# Patient Record
Sex: Female | Born: 1980 | Race: White | Hispanic: No | Marital: Married | Smoking: Former smoker
Health system: Southern US, Community
[De-identification: ages and names within clinical notes are randomized; demographics above are authoritative.]

## PROBLEM LIST (undated history)

## (undated) DIAGNOSIS — N200 Calculus of kidney: Secondary | ICD-10-CM

## (undated) DIAGNOSIS — G8929 Other chronic pain: Secondary | ICD-10-CM

## (undated) DIAGNOSIS — E559 Vitamin D deficiency, unspecified: Secondary | ICD-10-CM

## (undated) DIAGNOSIS — I10 Essential (primary) hypertension: Secondary | ICD-10-CM

## (undated) HISTORY — PX: ABDOMINAL HYSTERECTOMY: SHX81

## (undated) HISTORY — PX: TUBAL LIGATION: SHX77

---

## 2005-03-08 ENCOUNTER — Emergency Department: Payer: Self-pay | Admitting: Emergency Medicine

## 2005-04-20 ENCOUNTER — Emergency Department: Payer: Self-pay | Admitting: Emergency Medicine

## 2005-10-05 ENCOUNTER — Emergency Department: Payer: Self-pay | Admitting: Emergency Medicine

## 2008-08-21 ENCOUNTER — Emergency Department: Payer: Self-pay | Admitting: Emergency Medicine

## 2008-10-21 ENCOUNTER — Emergency Department: Payer: Self-pay | Admitting: Emergency Medicine

## 2008-11-24 ENCOUNTER — Emergency Department: Payer: Self-pay | Admitting: Emergency Medicine

## 2009-03-13 ENCOUNTER — Emergency Department: Payer: Self-pay | Admitting: Unknown Physician Specialty

## 2009-04-22 ENCOUNTER — Emergency Department: Payer: Self-pay | Admitting: Internal Medicine

## 2009-04-27 ENCOUNTER — Emergency Department: Payer: Self-pay | Admitting: Emergency Medicine

## 2009-05-05 ENCOUNTER — Ambulatory Visit: Payer: Self-pay | Admitting: Internal Medicine

## 2009-07-23 ENCOUNTER — Emergency Department: Payer: Self-pay | Admitting: Unknown Physician Specialty

## 2009-08-28 ENCOUNTER — Emergency Department: Payer: Self-pay | Admitting: Emergency Medicine

## 2009-09-13 ENCOUNTER — Emergency Department: Payer: Self-pay | Admitting: Emergency Medicine

## 2009-11-03 ENCOUNTER — Emergency Department: Payer: Self-pay | Admitting: Emergency Medicine

## 2009-11-24 ENCOUNTER — Emergency Department: Payer: Self-pay | Admitting: Emergency Medicine

## 2009-11-29 ENCOUNTER — Emergency Department: Payer: Self-pay | Admitting: Emergency Medicine

## 2010-03-12 ENCOUNTER — Emergency Department: Payer: Self-pay | Admitting: Emergency Medicine

## 2010-03-25 ENCOUNTER — Emergency Department: Payer: Self-pay | Admitting: Emergency Medicine

## 2010-05-11 ENCOUNTER — Emergency Department: Payer: Self-pay | Admitting: Emergency Medicine

## 2010-06-11 ENCOUNTER — Emergency Department: Payer: Self-pay | Admitting: Emergency Medicine

## 2010-08-10 ENCOUNTER — Emergency Department: Payer: Self-pay | Admitting: Emergency Medicine

## 2011-04-01 ENCOUNTER — Emergency Department: Payer: Self-pay | Admitting: Emergency Medicine

## 2011-07-14 IMAGING — US TRANSABDOMINAL ULTRASOUND OF PELVIS
1 series · 17 of 21 positions shown · non-contrast
Comparison: none

REASON FOR EXAM: lower abdominal pain hx of hysterectomy
COMMENTS:

[Series 1: transabdominal ultrasound of pelvis · 17 of 21 slices shown]
[im 1/21]
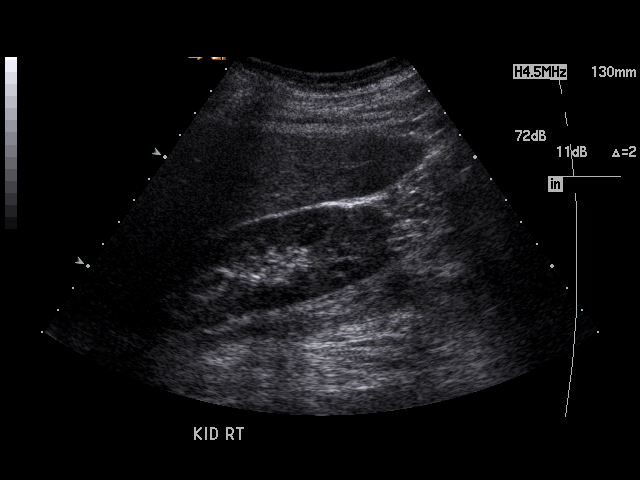
[im 2/21]
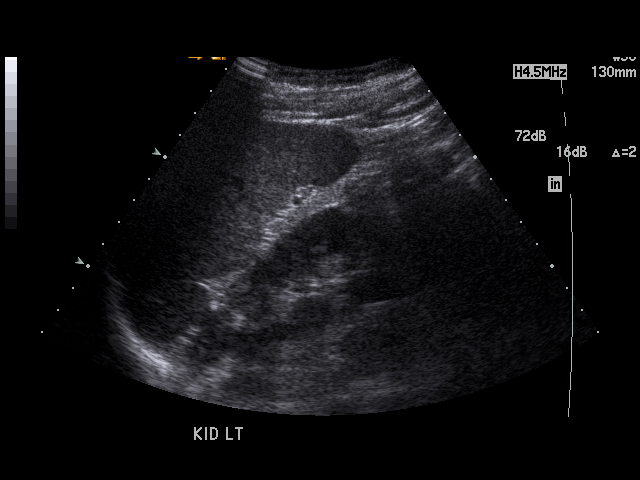
[im 4/21]
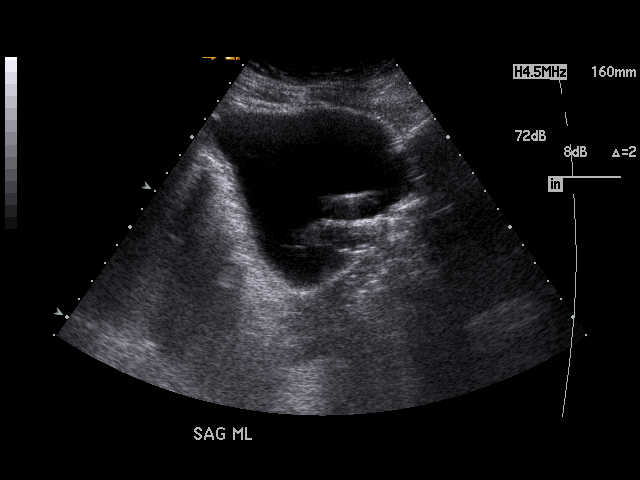
[im 5/21]
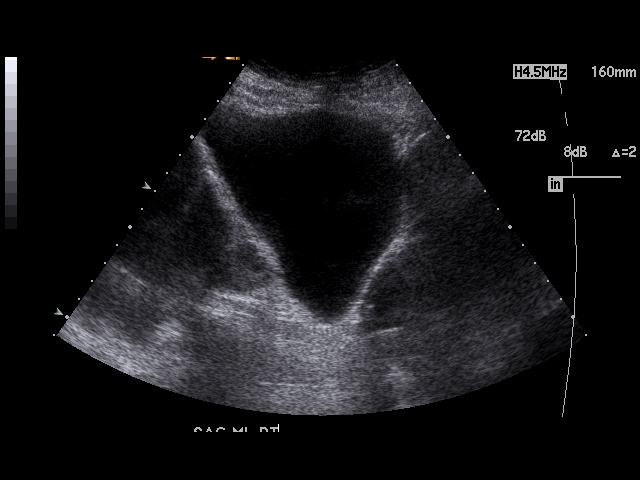
[im 6/21]
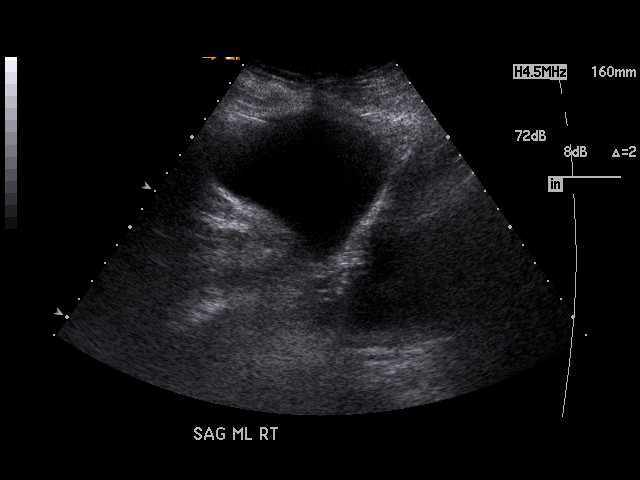
[im 7/21]
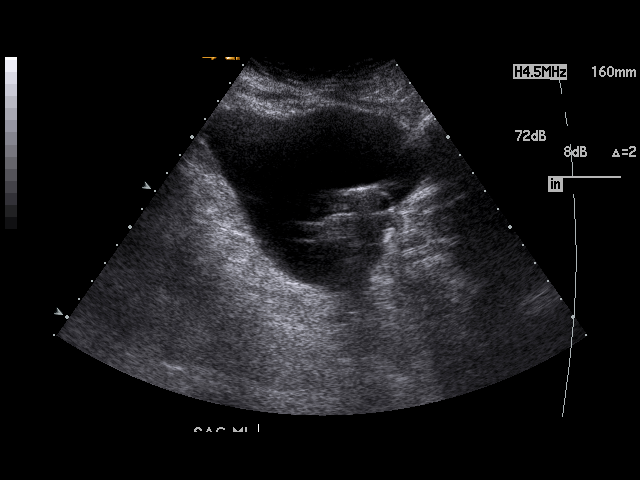
[im 9/21]
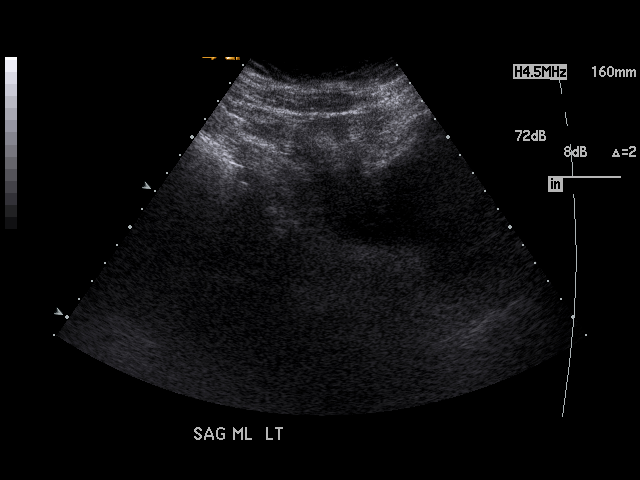
[im 10/21]
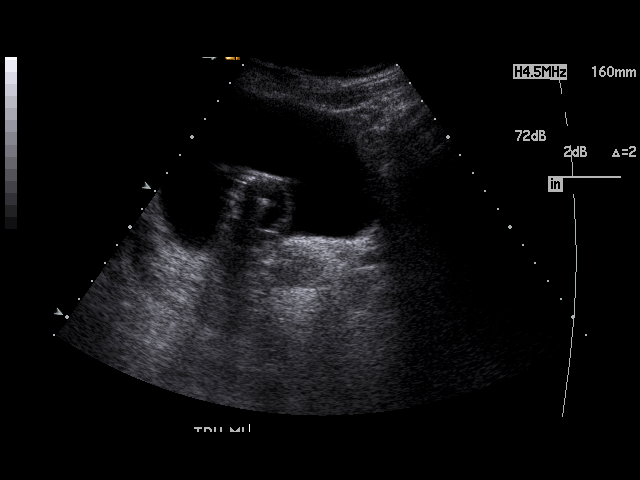
[im 11/21]
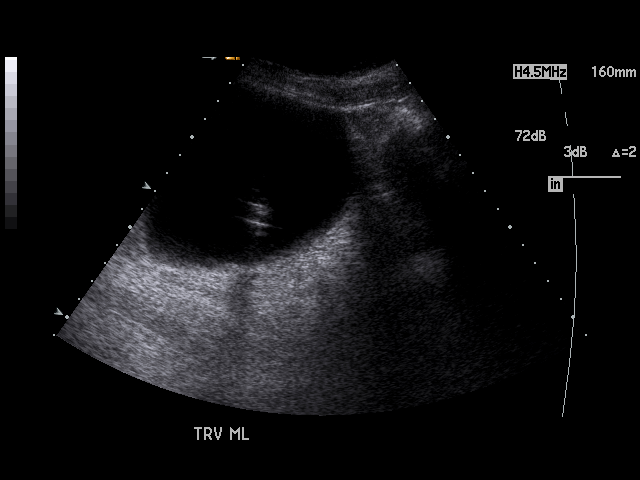
[im 12/21]
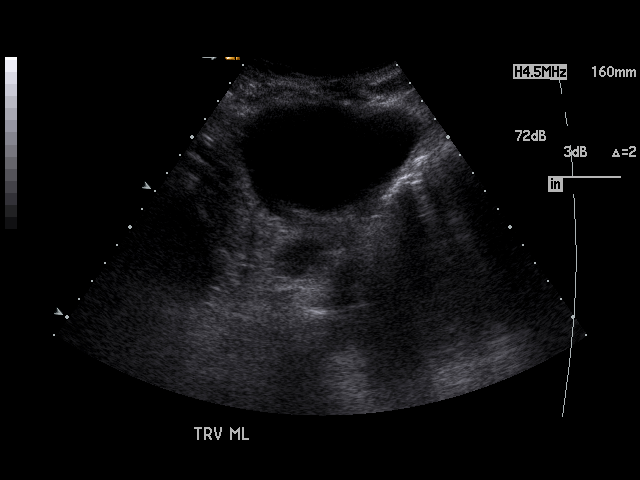
[im 13/21]
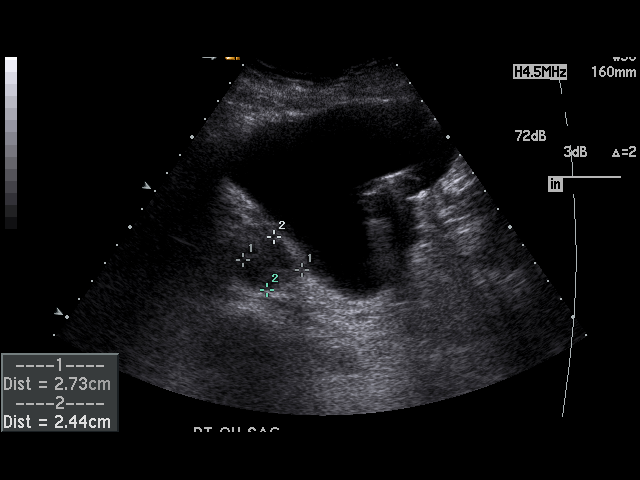
[im 15/21]
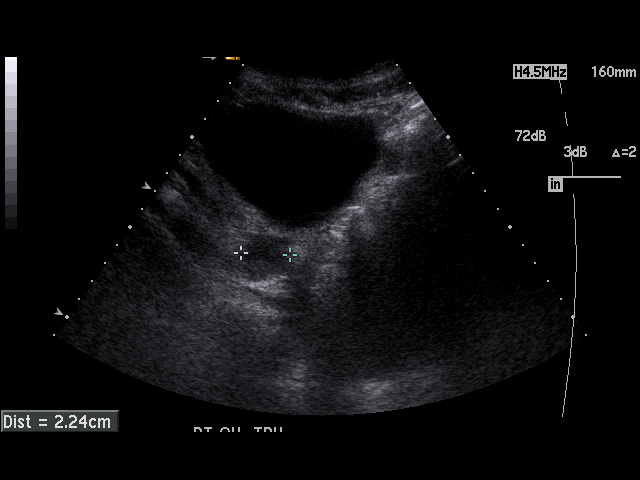
[im 16/21]
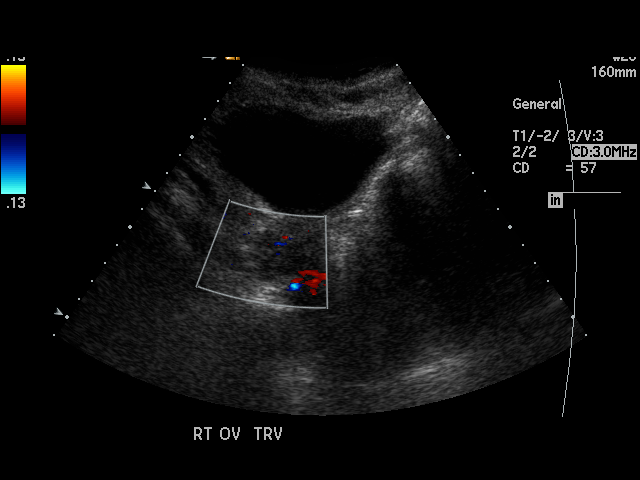
[im 17/21]
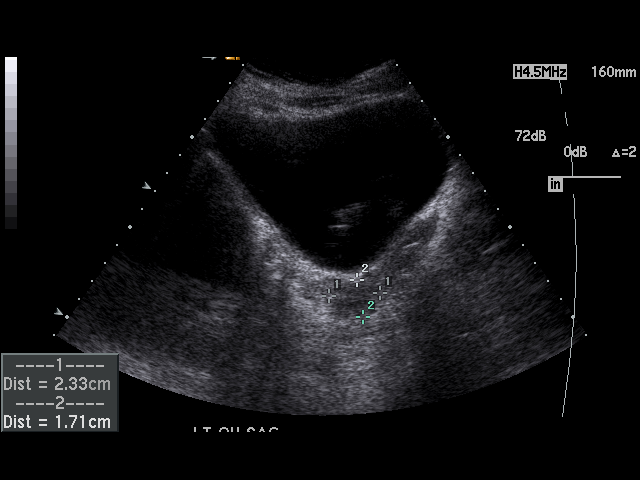
[im 18/21]
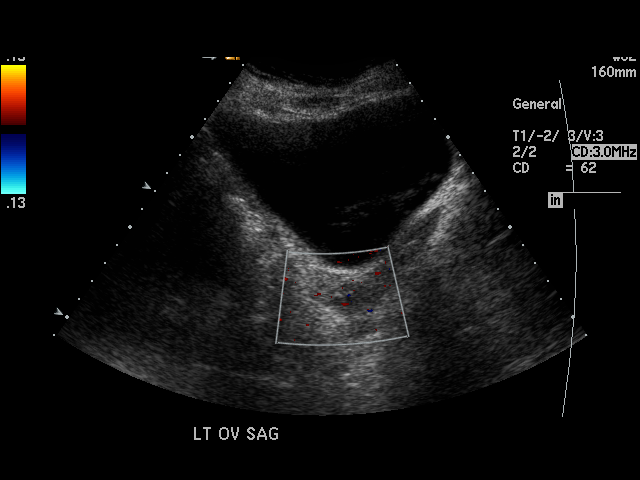
[im 20/21]
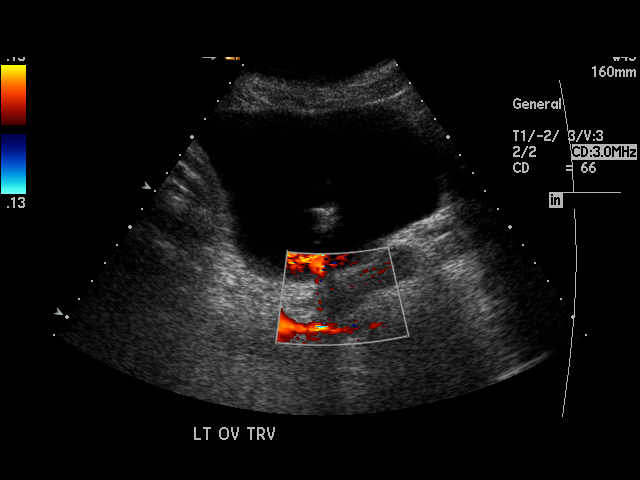
[im 21/21]
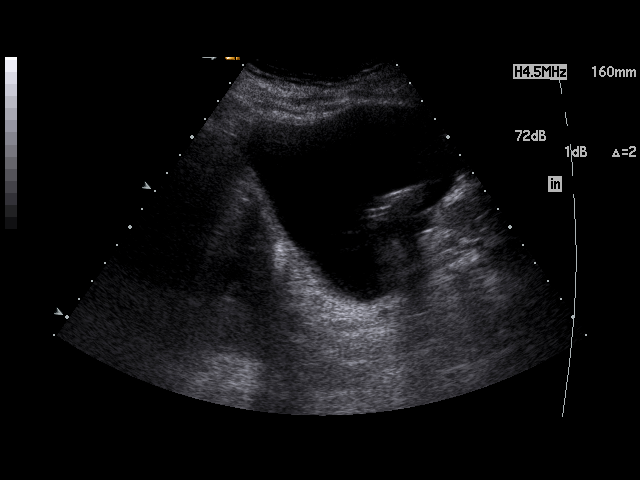

[17 of 21 positions shown; findings below may reference images not displayed]

PROCEDURE:     US  - US PELVIS EXAM  - August 10, 2010  [DATE]

RESULT:     Comparison: Pelvic ultrasound 06/11/2010

Technique and Findings:
Multiple grayscale and color Doppler images obtained of the pelvis.
Endovaginal ultrasound was not performed.

The uterus was not identified, consistent with prior hysterectomy. No fluid
identified in the cul-de-sac. A Foley catheter is present in the bladder.

The right ovary measures 2.7 x 2.4 x 2.2 cm. The left ovary measures 2.3 x
1.7 x 2.2 cm. Color Doppler flow is associated in the region of the ovaries.
Spectral Doppler imaging was not performed.
IMPRESSION: 1. No acute findings. Prior hysterectomy.
2. While the ovaries are normal in their transabdominal appearance, this
study does not exclude ovarian torsion. If there is continued clinical
concern, recommend endovaginal ultrasound with spectral Doppler imaging.

## 2011-08-31 ENCOUNTER — Emergency Department: Payer: Self-pay | Admitting: Emergency Medicine

## 2011-10-28 ENCOUNTER — Emergency Department: Payer: Self-pay | Admitting: Emergency Medicine

## 2011-11-16 ENCOUNTER — Emergency Department: Payer: Self-pay | Admitting: Internal Medicine

## 2012-03-11 ENCOUNTER — Emergency Department: Payer: Self-pay | Admitting: Emergency Medicine

## 2012-05-01 ENCOUNTER — Emergency Department: Payer: Self-pay | Admitting: *Deleted

## 2012-05-01 LAB — CBC WITH DIFFERENTIAL/PLATELET
Basophil %: 1.6 %
Eosinophil %: 0.9 %
HCT: 43.9 % (ref 35.0–47.0)
Lymphocyte %: 19.7 %
MCH: 29.5 pg (ref 26.0–34.0)
MCHC: 34.1 g/dL (ref 32.0–36.0)
MCV: 86 fL (ref 80–100)
Monocyte %: 5.5 %
Neutrophil #: 5.2 10*3/uL (ref 1.4–6.5)
Neutrophil %: 72.3 %
Platelet: 241 10*3/uL (ref 150–440)
RBC: 5.08 10*6/uL (ref 3.80–5.20)
RDW: 14.3 % (ref 11.5–14.5)

## 2012-05-01 LAB — BASIC METABOLIC PANEL
Anion Gap: 8 (ref 7–16)
Creatinine: 0.66 mg/dL (ref 0.60–1.30)
EGFR (Non-African Amer.): >60
Potassium: 3.6 mmol/L (ref 3.5–5.1)
Sodium: 142 mmol/L (ref 136–145)

## 2012-05-01 LAB — URINALYSIS, COMPLETE
Leukocyte Esterase: NEGATIVE
Nitrite: NEGATIVE
Ph: 8 (ref 4.5–8.0)
Protein: NEGATIVE
WBC UR: 1 /HPF (ref 0–5)

## 2012-07-11 ENCOUNTER — Emergency Department: Payer: Self-pay | Admitting: Emergency Medicine

## 2014-04-08 ENCOUNTER — Emergency Department: Payer: Self-pay | Admitting: Emergency Medicine

## 2014-10-03 ENCOUNTER — Emergency Department: Payer: Self-pay | Admitting: Student

## 2015-04-20 ENCOUNTER — Encounter: Payer: Self-pay | Admitting: Emergency Medicine

## 2015-04-20 ENCOUNTER — Emergency Department: Payer: Self-pay

## 2015-04-20 ENCOUNTER — Emergency Department
Admission: EM | Admit: 2015-04-20 | Discharge: 2015-04-20 | Disposition: A | Discharge status: Home / Self Care | Payer: Self-pay | Attending: Emergency Medicine | Admitting: Emergency Medicine

## 2015-04-20 DIAGNOSIS — Z3202 Encounter for pregnancy test, result negative: Secondary | ICD-10-CM | POA: Insufficient documentation

## 2015-04-20 DIAGNOSIS — Z720 Tobacco use: Secondary | ICD-10-CM | POA: Insufficient documentation

## 2015-04-20 DIAGNOSIS — R109 Unspecified abdominal pain: Secondary | ICD-10-CM

## 2015-04-20 DIAGNOSIS — R1010 Upper abdominal pain, unspecified: Secondary | ICD-10-CM | POA: Insufficient documentation

## 2015-04-20 DIAGNOSIS — I10 Essential (primary) hypertension: Secondary | ICD-10-CM | POA: Insufficient documentation

## 2015-04-20 HISTORY — DX: Other chronic pain: G89.29

## 2015-04-20 HISTORY — DX: Vitamin D deficiency, unspecified: E55.9

## 2015-04-20 HISTORY — DX: Essential (primary) hypertension: I10

## 2015-04-20 LAB — CBC WITH DIFFERENTIAL/PLATELET
Basophils Absolute: 0 10*3/uL (ref 0–0.1)
Basophils Relative: 0 %
EOS ABS: 0 10*3/uL (ref 0–0.7)
Eosinophils Relative: 1 %
HEMATOCRIT: 45.5 % (ref 35.0–47.0)
Hemoglobin: 15.5 g/dL (ref 12.0–16.0)
LYMPHS ABS: 1.3 10*3/uL (ref 1.0–3.6)
Lymphocytes Relative: 20 %
MCH: 27.7 pg (ref 26.0–34.0)
MCHC: 34 g/dL (ref 32.0–36.0)
MCV: 81.4 fL (ref 80.0–100.0)
Monocytes Absolute: 0.5 10*3/uL (ref 0.2–0.9)
Monocytes Relative: 8 %
NEUTROS PCT: 71 %
Neutro Abs: 4.4 10*3/uL (ref 1.4–6.5)
PLATELETS: 246 10*3/uL (ref 150–440)
RBC: 5.58 MIL/uL — ABNORMAL HIGH (ref 3.80–5.20)
RDW: 13.5 % (ref 11.5–14.5)
WBC: 6.2 10*3/uL (ref 3.6–11.0)

## 2015-04-20 LAB — URINALYSIS COMPLETE WITH MICROSCOPIC (ARMC ONLY)
Bilirubin Urine: NEGATIVE
GLUCOSE, UA: NEGATIVE mg/dL
KETONES UR: NEGATIVE mg/dL
LEUKOCYTES UA: NEGATIVE
NITRITE: NEGATIVE
Protein, ur: NEGATIVE mg/dL
SPECIFIC GRAVITY, URINE: 1.004 — AB (ref 1.005–1.030)
pH: 6 (ref 5.0–8.0)

## 2015-04-20 LAB — COMPREHENSIVE METABOLIC PANEL
ALBUMIN: 4.4 g/dL (ref 3.5–5.0)
ALK PHOS: 85 U/L (ref 38–126)
ALT: 18 U/L (ref 14–54)
AST: 36 U/L (ref 15–41)
Anion gap: 7 (ref 5–15)
BILIRUBIN TOTAL: 0.1 mg/dL — AB (ref 0.3–1.2)
BUN: 7 mg/dL (ref 6–20)
CO2: 25 mmol/L (ref 22–32)
Calcium: 8.9 mg/dL (ref 8.9–10.3)
Chloride: 104 mmol/L (ref 101–111)
Creatinine, Ser: 0.84 mg/dL (ref 0.44–1.00)
GFR calc Af Amer: >60 mL/min (ref >60)
GFR calc non Af Amer: >60 mL/min (ref >60)
GLUCOSE: 109 mg/dL — AB (ref 65–99)
Potassium: 3.7 mmol/L (ref 3.5–5.1)
Sodium: 136 mmol/L (ref 135–145)
Total Protein: 8.1 g/dL (ref 6.5–8.1)

## 2015-04-20 LAB — LIPASE, BLOOD: Lipase: 30 U/L (ref 22–51)

## 2015-04-20 LAB — PREGNANCY, URINE: Preg Test, Ur: NEGATIVE

## 2015-04-20 MED ORDER — OMEPRAZOLE 40 MG PO CPDR: 40.0000 mg | DELAYED_RELEASE_CAPSULE | Freq: Every day | ORAL | Status: DC

## 2015-04-20 MED ORDER — PANTOPRAZOLE SODIUM 40 MG PO TBEC
DELAYED_RELEASE_TABLET | ORAL | Status: AC
  Administered 2015-04-20: 40 mg via ORAL
  Filled 2015-04-20: qty 1

## 2015-04-20 MED ORDER — GI COCKTAIL ~~LOC~~
30.0000 mL | Freq: Once | ORAL | Status: AC
  Administered 2015-04-20: 30 mL via ORAL

## 2015-04-20 MED ORDER — SODIUM CHLORIDE 0.9 % IV SOLN
Freq: Once | INTRAVENOUS | Status: AC
  Administered 2015-04-20: 17:00:00 via INTRAVENOUS

## 2015-04-20 MED ORDER — HYDROCODONE-ACETAMINOPHEN 5-325 MG PO TABS: 1.0000 | ORAL_TABLET | ORAL | Status: DC | PRN

## 2015-04-20 MED ORDER — HYDROMORPHONE HCL 1 MG/ML IJ SOLN
INTRAMUSCULAR | Status: AC
  Filled 2015-04-20: qty 1

## 2015-04-20 MED ORDER — HYDROMORPHONE HCL 1 MG/ML IJ SOLN
INTRAMUSCULAR | Status: AC
  Administered 2015-04-20: 0.5 mg via INTRAVENOUS
  Filled 2015-04-20: qty 1

## 2015-04-20 MED ORDER — PANTOPRAZOLE SODIUM 40 MG PO TBEC
40.0000 mg | DELAYED_RELEASE_TABLET | Freq: Every day | ORAL | Status: DC
  Administered 2015-04-20: 40 mg via ORAL

## 2015-04-20 MED ORDER — ONDANSETRON HCL 4 MG/2ML IJ SOLN
INTRAMUSCULAR | Status: AC
  Filled 2015-04-20: qty 2

## 2015-04-20 MED ORDER — IOHEXOL 300 MG/ML  SOLN
100.0000 mL | Freq: Once | INTRAMUSCULAR | Status: AC | PRN
  Administered 2015-04-20: 100 mL via INTRAVENOUS

## 2015-04-20 MED ORDER — IOHEXOL 240 MG/ML SOLN
25.0000 mL | Freq: Once | INTRAMUSCULAR | Status: AC | PRN
  Administered 2015-04-20: 25 mL via ORAL

## 2015-04-20 MED ORDER — ONDANSETRON HCL 4 MG/2ML IJ SOLN
4.0000 mg | Freq: Once | INTRAMUSCULAR | Status: AC
  Administered 2015-04-20: 4 mg via INTRAVENOUS

## 2015-04-20 MED ORDER — HYDROMORPHONE HCL 1 MG/ML IJ SOLN
0.5000 mg | INTRAMUSCULAR | Status: AC | PRN
  Administered 2015-04-20 (×2): 0.5 mg via INTRAVENOUS

## 2015-04-20 MED ORDER — GI COCKTAIL ~~LOC~~
ORAL | Status: AC
  Administered 2015-04-20: 30 mL via ORAL
  Filled 2015-04-20: qty 30

## 2015-04-20 NOTE — Discharge Instructions (Signed)
Your ultrasound and CT of abdomen and pelvis were okay. We have not identified the cause of your upper abdominal discomfort today. Take omeprazole as prescribed. Follow-up with your regular doctor. He'll likely need to see a gastroenterologist as well. Return to the emergency department if your pain worsens or if he had other urgent concerns.  Abdominal Pain, Women Abdominal (stomach, pelvic, or belly) pain can be caused by many things. It is important to tell your doctor:  The location of the pain.  Does it come and go or is it present all the time?  Are there things that start the pain (eating certain foods, exercise)?  Are there other symptoms associated with the pain (fever, nausea, vomiting, diarrhea)? All of this is helpful to know when trying to find the cause of the pain. CAUSES   Stomach: virus or bacteria infection, or ulcer.  Intestine: appendicitis (inflamed appendix), regional ileitis (Crohn's disease), ulcerative colitis (inflamed colon), irritable bowel syndrome, diverticulitis (inflamed diverticulum of the colon), or cancer of the stomach or intestine.  Gallbladder disease or stones in the gallbladder.  Kidney disease, kidney stones, or infection.  Pancreas infection or cancer.  Fibromyalgia (pain disorder).  Diseases of the female organs:  Uterus: fibroid (non-cancerous) tumors or infection.  Fallopian tubes: infection or tubal pregnancy.  Ovary: cysts or tumors.  Pelvic adhesions (scar tissue).  Endometriosis (uterus lining tissue growing in the pelvis and on the pelvic organs).  Pelvic congestion syndrome (female organs filling up with blood just before the menstrual period).  Pain with the menstrual period.  Pain with ovulation (producing an egg).  Pain with an IUD (intrauterine device, birth control) in the uterus.  Cancer of the female organs.  Functional pain (pain not caused by a disease, may improve without treatment).  Psychological  pain.  Depression. DIAGNOSIS  Your doctor will decide the seriousness of your pain by doing an examination.  Blood tests.  X-rays.  Ultrasound.  CT scan (computed tomography, special type of X-ray).  MRI (magnetic resonance imaging).  Cultures, for infection.  Barium enema (dye inserted in the large intestine, to better view it with X-rays).  Colonoscopy (looking in intestine with a lighted tube).  Laparoscopy (minor surgery, looking in abdomen with a lighted tube).  Major abdominal exploratory surgery (looking in abdomen with a large incision). TREATMENT  The treatment will depend on the cause of the pain.   Many cases can be observed and treated at home.  Over-the-counter medicines recommended by your caregiver.  Prescription medicine.  Antibiotics, for infection.  Birth control pills, for painful periods or for ovulation pain.  Hormone treatment, for endometriosis.  Nerve blocking injections.  Physical therapy.  Antidepressants.  Counseling with a psychologist or psychiatrist.  Minor or major surgery. HOME CARE INSTRUCTIONS   Do not take laxatives, unless directed by your caregiver.  Take over-the-counter pain medicine only if ordered by your caregiver. Do not take aspirin because it can cause an upset stomach or bleeding.  Try a clear liquid diet (broth or water) as ordered by your caregiver. Slowly move to a bland diet, as tolerated, if the pain is related to the stomach or intestine.  Have a thermometer and take your temperature several times a day, and record it.  Bed rest and sleep, if it helps the pain.  Avoid sexual intercourse, if it causes pain.  Avoid stressful situations.  Keep your follow-up appointments and tests, as your caregiver orders.  If the pain does not go away with medicine  or surgery, you may try:  Acupuncture.  Relaxation exercises (yoga, meditation).  Group therapy.  Counseling. SEEK MEDICAL CARE IF:   You  notice certain foods cause stomach pain.  Your home care treatment is not helping your pain.  You need stronger pain medicine.  You want your IUD removed.  You feel faint or lightheaded.  You develop nausea and vomiting.  You develop a rash.  You are having side effects or an allergy to your medicine. SEEK IMMEDIATE MEDICAL CARE IF:   Your pain does not go away or gets worse.  You have a fever.  Your pain is felt only in portions of the abdomen. The right side could possibly be appendicitis. The left lower portion of the abdomen could be colitis or diverticulitis.  You are passing blood in your stools (bright red or black tarry stools, with or without vomiting).  You have blood in your urine.  You develop chills, with or without a fever.  You pass out. MAKE SURE YOU:   Understand these instructions.  Will watch your condition.  Will get help right away if you are not doing well or get worse. Document Released: 09/12/2007 Document Revised: 04/01/2014 Document Reviewed: 10/02/2009 [facility] Patient Information 2015 [employer] [place], [place]. This information is not intended to replace advice given to you by your health care provider. Make sure you discuss any questions you have with your health care provider.

## 2015-04-20 NOTE — ED Notes (Signed)
Patient left room for [place]

## 2015-04-20 NOTE — ED Notes (Addendum)
Pt reports for past couple of days abd pain with multiple episodes of diarrhea and one episode of vomiting yesterday. Denies fever. Reports "cold chills"

## 2015-04-20 NOTE — ED Provider Notes (Signed)
[facility] Emergency Department Provider Note  ____________________________________________  Time seen: 1625  I have reviewed the triage vital signs and the nursing notes.   HISTORY  Chief Complaint Abdominal Pain      HPI Shelly Munoz is a 34 y.o. female with upper abdominal pain for the past 3-4 days. The pain starting on Thursday was mild but has worsened and now is moderate to severe. She has a history of having had pancreatitis once before. This was a number of years ago and she says she is not sure if this is the same feeling or not. She does have some nausea. She feels the pain is radiating to her back primarily on the right side. She has had C-sections but no other surgical history. She still has her gallbladder.   Past Medical History  Diagnosis Date  . Vitamin D deficiency   . Hypertension   . Chronic pain     There are no active problems to display for this patient.   Past Surgical History  Procedure Laterality Date  . Cesarean section    . Tubal ligation    . Abdominal hysterectomy      Current Outpatient Rx  Name  Route  Sig  Dispense  Refill  . cholecalciferol (VITAMIN D) 1000 UNITS tablet   Oral   Take 1,000 Units by mouth.         Marland Kitchen HYDROcodone-acetaminophen (NORCO) 5-325 MG per tablet   Oral   Take 1 tablet by mouth every 4 (four) hours as needed for moderate pain.   10 tablet   0   . omeprazole (PRILOSEC) 40 MG capsule   Oral   Take 1 capsule (40 mg total) by mouth daily.   30 capsule   1     Allergies Toradol; Ketorolac; and Pyridium   History reviewed. No pertinent family history.  Social History History  Substance Use Topics  . Smoking status: Current Some Day Smoker -- 0.50 packs/day for 5 years    Types: Cigarettes  . Smokeless tobacco: Not on file  . Alcohol Use: No    Review of Systems  Constitutional: Negative for fever. ENT: Negative for sore throat. Cardiovascular: Negative for chest  pain. Respiratory: Negative for shortness of breath. Gastrointestinal: Notable for abdominal pain area to see history of present illness Genitourinary: Negative for dysuria. Musculoskeletal: Negative for back pain. Skin: Negative for rash. Neurological: Negative for headaches   10-point ROS otherwise negative.  ____________________________________________   PHYSICAL EXAM:  VITAL SIGNS: ED Triage Vitals  Enc Vitals Group     BP 04/20/15 1433 158/92 mmHg     Pulse Rate 04/20/15 1433 131     Resp 04/20/15 1433 20     Temp 04/20/15 1433 98.1 F (36.7 C)     Temp Source 04/20/15 1433 Oral     SpO2 04/20/15 1433 100 %     Weight 04/20/15 1433 170 lb (77.111 kg)     Height 04/20/15 1433 5' (1.524 m)     Head Cir --      Peak Flow --      Pain Score 04/20/15 1436 9     Pain Loc --      Pain Edu? --      Excl. in GC? --     Constitutional:  Alert and oriented. Well-nourished well-developed. She appears slightly uncomfortable.  ENT   Head: Normocephalic and atraumatic. Cardiovascular: Normal rate at 90, regular rhythm. Respiratory: Normal respiratory effort without  tachypnea. Breath sounds are clear and equal bilaterally. No wheezes/rales/rhonchi. Gastrointestinal: Soft. No distention. She has mild diffuse tenderness throughout but focal tenderness in the epigastric area. Discomfort in the right upper and left upper quadrants are equal.  Back: Mild muscle tension in her back. She does have CVA tenderness on the right. Musculoskeletal: Nontender with normal range of motion in all extremities.  No noted edema. Neurologic:  Normal speech and language. No gross focal neurologic deficits are appreciated.  Skin:  Skin is warm, dry. No rash noted. Psychiatric: Mood and affect are normal. Speech and behavior are normal.  ____________________________________________    LABS (pertinent positives/negatives)  White blood cell count is normal at 6.2 hemoglobin 15.5. Metabolic panel is  within normal limits. Liver enzymes are all within normal limits. Lipase is normal at 30. Urinalysis does not show any sign of infection.  ____________________________________________  RADIOLOGY  Ultrasound: No gallbladder stones no ductal dilatation. CT scan abdomen and pelvis: No acute changes.  ____________________________________________   ____________________________________________   INITIAL IMPRESSION / ASSESSMENT AND PLAN / ED COURSE  We will obtain an ultrasound of the right upper quadrant. Given the normal liver enzymes and the more central aspect of her pain my suspicion for biliary robins is low. If the ultrasound is normal we will pursue a CT scan through her belly.  I will treat her pain with Dilaudid and give her 1 L of normal saline IV.  ----------------------------------------- 9:43 PM on 04/20/2015 -----------------------------------------  Patient rechecked. She has had a little bit of discomfort recently but overall is feeling better. She has a negative ultrasound and a negative CT scan. She has normal blood tests and a normal lipase level.  I discussed with the patient [facility]. She may need to see a gastroenterologist as well. We will start her on omeprazole. I will prescribe a small amount of Vicodin for her discomfort. ____________________________________________   FINAL CLINICAL IMPRESSION(S) / ED DIAGNOSES  Final diagnoses:  Abdominal pain   unknown etiology.     Darien Ramusavid W Addie Alonge, MD 04/20/15 2147

## 2015-04-20 NOTE — ED Notes (Signed)
Pt finished contrast, CT called.

## 2015-04-20 NOTE — ED Notes (Signed)
Patient returned from US.

## 2015-08-08 ENCOUNTER — Encounter: Payer: Self-pay | Admitting: Urology

## 2015-08-08 ENCOUNTER — Ambulatory Visit: Payer: Self-pay | Admitting: Urology

## 2017-04-02 ENCOUNTER — Emergency Department
Admission: EM | Admit: 2017-04-02 | Discharge: 2017-04-02 | Disposition: A | Discharge status: Home / Self Care | Payer: Managed Care, Other (non HMO) | Attending: Emergency Medicine | Admitting: Emergency Medicine

## 2017-04-02 ENCOUNTER — Encounter: Payer: Self-pay | Admitting: Emergency Medicine

## 2017-04-02 ENCOUNTER — Emergency Department: Payer: Managed Care, Other (non HMO)

## 2017-04-02 DIAGNOSIS — R109 Unspecified abdominal pain: Secondary | ICD-10-CM | POA: Diagnosis not present

## 2017-04-02 DIAGNOSIS — F17210 Nicotine dependence, cigarettes, uncomplicated: Secondary | ICD-10-CM | POA: Insufficient documentation

## 2017-04-02 DIAGNOSIS — I10 Essential (primary) hypertension: Secondary | ICD-10-CM | POA: Insufficient documentation

## 2017-04-02 DIAGNOSIS — Z79899 Other long term (current) drug therapy: Secondary | ICD-10-CM | POA: Diagnosis not present

## 2017-04-02 HISTORY — DX: Calculus of kidney: N20.0

## 2017-04-02 LAB — BASIC METABOLIC PANEL
Anion gap: 9 (ref 5–15)
BUN: 7 mg/dL (ref 6–20)
CALCIUM: 9.3 mg/dL (ref 8.9–10.3)
CO2: 23 mmol/L (ref 22–32)
Chloride: 101 mmol/L (ref 101–111)
Creatinine, Ser: 0.86 mg/dL (ref 0.44–1.00)
GFR calc Af Amer: >60 mL/min (ref >60)
GFR calc non Af Amer: >60 mL/min (ref >60)
Glucose, Bld: 141 mg/dL — ABNORMAL HIGH (ref 65–99)
POTASSIUM: 3.6 mmol/L (ref 3.5–5.1)
SODIUM: 133 mmol/L — AB (ref 135–145)

## 2017-04-02 LAB — CBC
HCT: 48.2 % — ABNORMAL HIGH (ref 35.0–47.0)
Hemoglobin: 16.3 g/dL — ABNORMAL HIGH (ref 12.0–16.0)
MCH: 28.1 pg (ref 26.0–34.0)
MCHC: 33.9 g/dL (ref 32.0–36.0)
MCV: 83 fL (ref 80.0–100.0)
Platelets: 266 10*3/uL (ref 150–440)
RBC: 5.8 MIL/uL — AB (ref 3.80–5.20)
RDW: 13.4 % (ref 11.5–14.5)
WBC: 9.8 10*3/uL (ref 3.6–11.0)

## 2017-04-02 LAB — URINALYSIS, COMPLETE (UACMP) WITH MICROSCOPIC
BILIRUBIN URINE: NEGATIVE
Bacteria, UA: NONE SEEN
Glucose, UA: NEGATIVE mg/dL
Ketones, ur: NEGATIVE mg/dL
Leukocytes, UA: NEGATIVE
Nitrite: NEGATIVE
PH: 6 (ref 5.0–8.0)
Protein, ur: NEGATIVE mg/dL
RBC / HPF: NONE SEEN RBC/hpf (ref 0–5)
SPECIFIC GRAVITY, URINE: 1.001 — AB (ref 1.005–1.030)

## 2017-04-02 MED ORDER — MORPHINE SULFATE (PF) 4 MG/ML IV SOLN
4.0000 mg | Freq: Once | INTRAVENOUS | Status: AC
  Administered 2017-04-02: 4 mg via INTRAVENOUS
  Filled 2017-04-02: qty 1

## 2017-04-02 MED ORDER — SODIUM CHLORIDE 0.9 % IV BOLUS (SEPSIS)
1000.0000 mL | Freq: Once | INTRAVENOUS | Status: AC
  Administered 2017-04-02: 1000 mL via INTRAVENOUS

## 2017-04-02 MED ORDER — ONDANSETRON HCL 4 MG/2ML IJ SOLN
4.0000 mg | Freq: Once | INTRAMUSCULAR | Status: AC
  Administered 2017-04-02: 4 mg via INTRAVENOUS
  Filled 2017-04-02: qty 2

## 2017-04-02 NOTE — ED Notes (Signed)
Reviewed d/c instructions, follow-up care with patient. Pt verbalized understanding. Pt being driven home by spouse

## 2017-04-02 NOTE — ED Notes (Signed)
ED Provider at bedside. 

## 2017-04-02 NOTE — ED Triage Notes (Signed)
Pt presents to ED via POV, with reports of left flank pain and lower abdominal pain. Pt states sxs resemble prior kidney stones. Pt states the pain started on Wednesday. C/o decreased urination and decreased appetite. Denies fevers or vomiting. C/o nausea.

## 2017-04-02 NOTE — ED Provider Notes (Signed)
[facility]  ____________________________________________   First MD Initiated Contact with Patient 04/02/17 1845     (approximate)  I have reviewed the triage vital signs and the nursing notes.   HISTORY  Chief Complaint Flank Pain   HPI Ramonita LabJennifer L Slevin is a 36 y.o. female with history of multiple kidney stones was presenting with left flank pain over the past 4 days. She says the pain is now 7 out of 10 and sharp. He says it is radiating into her pelvis. She says that it is associated with nausea and diarrhea which is typical for her with her stones. She denies ever needing to have a stent or lithotripsy. She says she has been taking tramadol for pain relief but it is not been helping.   Past Medical History:  Diagnosis Date  . Chronic pain   . Hypertension   . Nephrolithiasis   . Vitamin D deficiency     There are no active problems to display for this patient.   Past Surgical History:  Procedure Laterality Date  . ABDOMINAL HYSTERECTOMY    . CESAREAN SECTION    . TUBAL LIGATION      Prior to Admission medications   Medication Sig Start Date End Date Taking? Authorizing Provider  cholecalciferol (VITAMIN D) 1000 UNITS tablet Take 1,000 Units by mouth.    [provider]  HYDROcodone-acetaminophen (NORCO) 5-325 MG per tablet Take 1 tablet by mouth every 4 (four) hours as needed for moderate pain. 04/20/15   Darien RamusKaminski, David W, MD  omeprazole (PRILOSEC) 40 MG capsule Take 1 capsule (40 mg total) by mouth daily. 04/20/15 04/19/16  Darien RamusKaminski, David W, MD    Allergies Flomax [tamsulosin hcl]; Toradol [ketorolac tromethamine]; Ketorolac; and Pyridium  [phenazopyridine hcl]  History reviewed. No pertinent family history.  Social History Social History  Substance Use Topics  . Smoking status: Current Every Day Smoker    Packs/day: 0.50    Years: 5.00    Types: Cigarettes  . Smokeless tobacco: Never  Used  . Alcohol use No    Review of Systems  Constitutional: No fever/chills Eyes: No visual changes. ENT: No sore throat. Cardiovascular: Denies chest pain. Respiratory: Denies shortness of breath. Gastrointestinal:  no vomiting.  No constipation. Genitourinary: Negative for dysuria. Musculoskeletal: as above Skin: Negative for rash. Neurological: Negative for headaches, focal weakness or numbness.   ____________________________________________   PHYSICAL EXAM:  VITAL SIGNS: ED Triage Vitals  Enc Vitals Group     BP 04/02/17 1650 (!) 156/97     Pulse Rate 04/02/17 1650 (!) 138     Resp 04/02/17 1650 16     Temp 04/02/17 1650 98.1 F (36.7 C)     Temp Source 04/02/17 1650 Oral     SpO2 04/02/17 1650 100 %     Weight 04/02/17 1650 160 lb (72.6 kg)     Height 04/02/17 1650 5' (1.524 m)     Head Circumference --      Peak Flow --      Pain Score 04/02/17 1653 7     Pain Loc --      Pain Edu? --      Excl. in GC? --     Constitutional: Alert and oriented. Appears uncomfortable. Eyes: Conjunctivae are normal. PERRL. EOMI. Head: Atraumatic. Nose: No congestion/rhinnorhea. Mouth/Throat: Mucous membranes are moist.   Neck: No stridor.   Cardiovascular: Normal rate, regular rhythm. Grossly normal heart sounds.   Respiratory: Normal  respiratory effort.  No retractions. Lungs CTAB. Gastrointestinal: Soft Left-sided abdominal pain that is moderate without any rebound or guarding. No distention. Left-sided CVA tenderness to palpation. Musculoskeletal: No lower extremity tenderness nor edema.  No joint effusions. Neurologic:  Normal speech and language. No gross focal neurologic deficits are appreciated.  Skin:  Skin is warm, dry and intact. No rash noted. Psychiatric: Mood and affect are normal. Speech and behavior are normal.  ____________________________________________   LABS (all labs ordered are listed, but only abnormal results are displayed)  Labs Reviewed    URINALYSIS, COMPLETE (UACMP) WITH MICROSCOPIC - Abnormal; Notable for the following:       Result Value   Color, Urine STRAW (*)    APPearance HAZY (*)    Specific Gravity, Urine 1.001 (*)    Hgb urine dipstick SMALL (*)    Squamous Epithelial / LPF 0-5 (*)    All other components within normal limits  CBC - Abnormal; Notable for the following:    RBC 5.80 (*)    Hemoglobin 16.3 (*)    HCT 48.2 (*)    All other components within normal limits  BASIC METABOLIC PANEL - Abnormal; Notable for the following:    Sodium 133 (*)    Glucose, Bld 141 (*)    All other components within normal limits   ____________________________________________  EKG   ____________________________________________  RADIOLOGY  CT RENAL STONE STUDY (Final result)  Result time 04/02/17 19:43:09  Final result by Adrian Prows, MD (04/02/17 19:43:09)           Narrative:   CLINICAL DATA: Left flank pain for 3 days  EXAM: CT ABDOMEN AND PELVIS WITHOUT CONTRAST  TECHNIQUE: Multidetector CT imaging of the abdomen and pelvis was performed following the standard protocol without IV contrast.  COMPARISON: 04/20/2015  FINDINGS: Lower chest: Mild subpleural nodularity on the right is unchanged. No acute consolidation or pleural effusion. The heart is nonenlarged.  Hepatobiliary: No focal liver abnormality is seen. No gallstones, gallbladder wall thickening, or biliary dilatation.  Pancreas: Unremarkable. No pancreatic ductal dilatation or surrounding inflammatory changes.  Spleen: Normal in size without focal abnormality.  Adrenals/Urinary Tract: Adrenal glands are unremarkable. Kidneys are normal, without renal calculi, focal lesion, or hydronephrosis. Bladder is unremarkable.  Stomach/Bowel: Stomach is within normal limits. Appendix appears normal. No evidence of bowel wall thickening, distention, or inflammatory changes.  Vascular/Lymphatic: No significant vascular findings are  present. No enlarged abdominal or pelvic lymph nodes.  Reproductive: Status post hysterectomy. No adnexal masses.  Other: Small at the umbilicus. No free air or free fluid.  Musculoskeletal: No acute or significant osseous findings.  IMPRESSION: Negative for nephrolithiasis, hydronephrosis, or urethral stone. There are no acute abnormalities visualized.   Electronically Signed By: Jasmine Pang M.D. On: 04/02/2017 19:43            ____________________________________________   PROCEDURES  Procedure(s) performed:   Procedures  Critical Care performed:   ____________________________________________   INITIAL IMPRESSION / ASSESSMENT AND PLAN / ED COURSE  Pertinent labs & imaging results that were available during my care of the patient were reviewed by me and considered in my medical decision making (see chart for details).  Previous scans reviewed and do not see any renal ultrasounds or stones on her previous CAT scan. We will do a renal scan at this time. We'll also treat patient with morphine and Zofran. Says that she has an allergy to Toradol.     ----------------------------------------- 9:18 PM on 04/02/2017 -----------------------------------------  Patient without findings of stone on the CT scan. Also examine the flank and there is no rash. Patient denies any shortness of breath or chest pain. No increased pain with deep breathing. Unclear cause of the flank pain. Patient will be discharged and will follow-up with her primary care doctor. Says that it felt like a stone and asked if it's possibility of the stone pass but says she is still having mild pain. However, no distress at this time. Heart rate in the 80s. Unlikely to be pulmonary embolus. ____________________________________________   FINAL CLINICAL IMPRESSION(S) / ED DIAGNOSES  Final diagnoses:  Left flank pain      NEW MEDICATIONS STARTED DURING THIS VISIT:  New Prescriptions   No  medications on file     Note:  This document was prepared using Dragon voice recognition software and may include unintentional dictation errors.    Myrna Blazer, MD 04/02/17 2119

## 2021-01-11 ENCOUNTER — Emergency Department
Admission: EM | Admit: 2021-01-11 | Discharge: 2021-01-11 | Disposition: A | Discharge status: Home / Self Care | Payer: Self-pay | Attending: Emergency Medicine | Admitting: Emergency Medicine

## 2021-01-11 ENCOUNTER — Other Ambulatory Visit: Payer: Self-pay

## 2021-01-11 ENCOUNTER — Emergency Department: Payer: Self-pay

## 2021-01-11 DIAGNOSIS — R519 Headache, unspecified: Secondary | ICD-10-CM | POA: Insufficient documentation

## 2021-01-11 DIAGNOSIS — F17210 Nicotine dependence, cigarettes, uncomplicated: Secondary | ICD-10-CM | POA: Insufficient documentation

## 2021-01-11 DIAGNOSIS — M25551 Pain in right hip: Secondary | ICD-10-CM | POA: Insufficient documentation

## 2021-01-11 DIAGNOSIS — F1490 Cocaine use, unspecified, uncomplicated: Secondary | ICD-10-CM | POA: Insufficient documentation

## 2021-01-11 DIAGNOSIS — M791 Myalgia, unspecified site: Secondary | ICD-10-CM | POA: Insufficient documentation

## 2021-01-11 DIAGNOSIS — M542 Cervicalgia: Secondary | ICD-10-CM | POA: Insufficient documentation

## 2021-01-11 DIAGNOSIS — S0011XA Contusion of right eyelid and periocular area, initial encounter: Secondary | ICD-10-CM | POA: Insufficient documentation

## 2021-01-11 DIAGNOSIS — I10 Essential (primary) hypertension: Secondary | ICD-10-CM | POA: Insufficient documentation

## 2021-01-11 DIAGNOSIS — F1910 Other psychoactive substance abuse, uncomplicated: Secondary | ICD-10-CM

## 2021-01-11 DIAGNOSIS — F1099 Alcohol use, unspecified with unspecified alcohol-induced disorder: Secondary | ICD-10-CM | POA: Insufficient documentation

## 2021-01-11 MED ORDER — ACETAMINOPHEN 500 MG PO TABS
1000.0000 mg | ORAL_TABLET | Freq: Once | ORAL | Status: AC
  Administered 2021-01-11: 1000 mg via ORAL
  Filled 2021-01-11: qty 2

## 2021-01-11 MED ORDER — KETOROLAC TROMETHAMINE 30 MG/ML IJ SOLN
15.0000 mg | Freq: Once | INTRAMUSCULAR | Status: AC
  Administered 2021-01-11: 15 mg via INTRAVENOUS
  Filled 2021-01-11: qty 1

## 2021-01-11 MED ORDER — ONDANSETRON HCL 4 MG/2ML IJ SOLN
4.0000 mg | Freq: Once | INTRAMUSCULAR | Status: AC
  Administered 2021-01-11: 4 mg via INTRAVENOUS
  Filled 2021-01-11: qty 2

## 2021-01-11 MED ORDER — IOHEXOL 350 MG/ML SOLN
75.0000 mL | Freq: Once | INTRAVENOUS | Status: AC | PRN
  Administered 2021-01-11: 75 mL via INTRAVENOUS
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
## 2021-01-11 NOTE — ED Notes (Signed)
SANE nurse in with pt
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
## 2021-01-11 NOTE — Discharge Instructions (Addendum)
Please take Tylenol and ibuprofen/Advil for your pain.  It is safe to take them together, or to alternate them every few hours.  Take up to 1000mg of Tylenol at a time, up to 4 times per day.  Do not take more than 4000 mg of Tylenol in 24 hours.  For ibuprofen, take 400-600 mg, 4-5 times per day. ° ° °

## 2021-01-11 NOTE — ED Notes (Signed)
Pt's husband to pick up pt in 30 minutes

## 2021-01-11 NOTE — ED Notes (Addendum)
This RN sat and spoke with patient after she had expressed that she did not have anywhere to go and was ready to talk to someone about her current relationship and living situation. She had previously been offered to see the [facility] nurse earlier in the evening but "she wasn't ready to answer questions".  Report given to SANE nurse.

## 2021-01-11 NOTE — ED Provider Notes (Signed)
  Patient received in signout from Dr. Don Perking pending S[facility] nurse evaluation. SANE came by to evaluate the patient, but patient is now refusing their evaluation and will not effectively interact or wake up for this evaluation.  I go to reassess the patient and she indicates that she is ready to go home.  She reports calling her ex-husband already and having him call patient's father to come pick the patient up.  She denies any suicidality and does not want help for polysubstance abuse at this time.  We discussed following up with [facility] as an outpatient.  We discussed return precautions for the ED.  Patient medically stable for outpatient management.   Delton Prairie, MD 01/11/21 [phone]
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
## 2021-01-11 NOTE — ED Notes (Signed)
Pt refused wheelchair, this RN walked with pt to husband's car. Pt given resources for mental health and substance abuse counseling. Pt encouraged to seek help from these places.Pt states understanding.
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
## 2021-01-11 NOTE — ED Notes (Signed)
Pt's emergency contact called, [name]. [name] states that he and pt's father are not available to pick her up. Pt state she does not have a phone and can't remember anyone's numbers. Mr. Kendra to call back.

## 2021-05-12 ENCOUNTER — Other Ambulatory Visit: Payer: Self-pay

## 2021-05-12 ENCOUNTER — Emergency Department: Payer: BC Managed Care – PPO

## 2021-05-12 DIAGNOSIS — K760 Fatty (change of) liver, not elsewhere classified: Secondary | ICD-10-CM | POA: Diagnosis present

## 2021-05-12 DIAGNOSIS — I10 Essential (primary) hypertension: Secondary | ICD-10-CM | POA: Diagnosis present

## 2021-05-12 DIAGNOSIS — Z885 Allergy status to narcotic agent status: Secondary | ICD-10-CM

## 2021-05-12 DIAGNOSIS — A4151 Sepsis due to Escherichia coli [E. coli]: Secondary | ICD-10-CM | POA: Diagnosis not present

## 2021-05-12 DIAGNOSIS — F17210 Nicotine dependence, cigarettes, uncomplicated: Secondary | ICD-10-CM | POA: Diagnosis present

## 2021-05-12 DIAGNOSIS — Z888 Allergy status to other drugs, medicaments and biological substances status: Secondary | ICD-10-CM

## 2021-05-12 DIAGNOSIS — N390 Urinary tract infection, site not specified: Secondary | ICD-10-CM | POA: Diagnosis present

## 2021-05-12 DIAGNOSIS — R1011 Right upper quadrant pain: Secondary | ICD-10-CM | POA: Diagnosis not present

## 2021-05-12 DIAGNOSIS — Z20822 Contact with and (suspected) exposure to covid-19: Secondary | ICD-10-CM | POA: Diagnosis present

## 2021-05-12 DIAGNOSIS — Z79899 Other long term (current) drug therapy: Secondary | ICD-10-CM

## 2021-05-12 DIAGNOSIS — G8929 Other chronic pain: Secondary | ICD-10-CM | POA: Diagnosis present

## 2021-05-12 LAB — CBC WITH DIFFERENTIAL/PLATELET
Abs Immature Granulocytes: 0.28 10*3/uL — ABNORMAL HIGH (ref 0.00–0.07)
Basophils Absolute: 0.1 10*3/uL (ref 0.0–0.1)
Basophils Relative: 0 %
Eosinophils Absolute: 0 10*3/uL (ref 0.0–0.5)
Eosinophils Relative: 0 %
HCT: 34 % — ABNORMAL LOW (ref 36.0–46.0)
Hemoglobin: 11.1 g/dL — ABNORMAL LOW (ref 12.0–15.0)
Immature Granulocytes: 1 %
Lymphocytes Relative: 5 %
Lymphs Abs: 1.1 10*3/uL (ref 0.7–4.0)
MCH: 28.2 pg (ref 26.0–34.0)
MCHC: 32.6 g/dL (ref 30.0–36.0)
MCV: 86.5 fL (ref 80.0–100.0)
Monocytes Absolute: 0.9 10*3/uL (ref 0.1–1.0)
Monocytes Relative: 4 %
Neutro Abs: 20.7 10*3/uL — ABNORMAL HIGH (ref 1.7–7.7)
Neutrophils Relative %: 90 %
Platelets: 425 10*3/uL — ABNORMAL HIGH (ref 150–400)
RBC: 3.93 MIL/uL (ref 3.87–5.11)
RDW: 14.5 % (ref 11.5–15.5)
WBC: 23.1 10*3/uL — ABNORMAL HIGH (ref 4.0–10.5)
nRBC: 0 % (ref 0.0–0.2)

## 2021-05-12 LAB — COMPREHENSIVE METABOLIC PANEL
ALT: 29 U/L (ref 0–44)
AST: 22 U/L (ref 15–41)
Albumin: 2.7 g/dL — ABNORMAL LOW (ref 3.5–5.0)
Alkaline Phosphatase: 430 U/L — ABNORMAL HIGH (ref 38–126)
Anion gap: 9 (ref 5–15)
BUN: 16 mg/dL (ref 6–20)
CO2: 26 mmol/L (ref 22–32)
Calcium: 9 mg/dL (ref 8.9–10.3)
Chloride: 98 mmol/L (ref 98–111)
Creatinine, Ser: 1.2 mg/dL — ABNORMAL HIGH (ref 0.44–1.00)
GFR, Estimated: 59 mL/min — ABNORMAL LOW (ref >60)
Glucose, Bld: 169 mg/dL — ABNORMAL HIGH (ref 70–99)
Potassium: 4.3 mmol/L (ref 3.5–5.1)
Sodium: 133 mmol/L — ABNORMAL LOW (ref 135–145)
Total Bilirubin: 0.7 mg/dL (ref 0.3–1.2)
Total Protein: 6.8 g/dL (ref 6.5–8.1)

## 2021-05-12 NOTE — ED Triage Notes (Signed)
Pt present via POV c/o right flank pain for a few days. Reports urinary hesitancy. Denies dysuria. + nausea.

## 2021-05-13 ENCOUNTER — Inpatient Hospital Stay
Admission: EM | Admit: 2021-05-13 | Discharge: 2021-05-15 | DRG: 872 | Disposition: A | Discharge status: Home / Self Care | Payer: BC Managed Care – PPO | Attending: Internal Medicine | Admitting: Internal Medicine

## 2021-05-13 ENCOUNTER — Inpatient Hospital Stay: Payer: BC Managed Care – PPO

## 2021-05-13 ENCOUNTER — Other Ambulatory Visit: Payer: Self-pay

## 2021-05-13 ENCOUNTER — Encounter: Payer: Self-pay | Admitting: Internal Medicine

## 2021-05-13 ENCOUNTER — Emergency Department: Payer: BC Managed Care – PPO

## 2021-05-13 DIAGNOSIS — Z20822 Contact with and (suspected) exposure to covid-19: Secondary | ICD-10-CM | POA: Diagnosis present

## 2021-05-13 DIAGNOSIS — R1011 Right upper quadrant pain: Secondary | ICD-10-CM

## 2021-05-13 DIAGNOSIS — I10 Essential (primary) hypertension: Secondary | ICD-10-CM | POA: Diagnosis present

## 2021-05-13 DIAGNOSIS — A4151 Sepsis due to Escherichia coli [E. coli]: Secondary | ICD-10-CM | POA: Diagnosis present

## 2021-05-13 DIAGNOSIS — A419 Sepsis, unspecified organism: Secondary | ICD-10-CM | POA: Diagnosis not present

## 2021-05-13 DIAGNOSIS — R109 Unspecified abdominal pain: Secondary | ICD-10-CM | POA: Diagnosis not present

## 2021-05-13 DIAGNOSIS — G8929 Other chronic pain: Secondary | ICD-10-CM | POA: Diagnosis present

## 2021-05-13 DIAGNOSIS — F17210 Nicotine dependence, cigarettes, uncomplicated: Secondary | ICD-10-CM | POA: Diagnosis present

## 2021-05-13 DIAGNOSIS — Z79899 Other long term (current) drug therapy: Secondary | ICD-10-CM | POA: Diagnosis not present

## 2021-05-13 DIAGNOSIS — Z888 Allergy status to other drugs, medicaments and biological substances status: Secondary | ICD-10-CM | POA: Diagnosis not present

## 2021-05-13 DIAGNOSIS — K760 Fatty (change of) liver, not elsewhere classified: Secondary | ICD-10-CM | POA: Diagnosis present

## 2021-05-13 DIAGNOSIS — N390 Urinary tract infection, site not specified: Secondary | ICD-10-CM | POA: Diagnosis present

## 2021-05-13 DIAGNOSIS — Z885 Allergy status to narcotic agent status: Secondary | ICD-10-CM | POA: Diagnosis not present

## 2021-05-13 LAB — URINALYSIS, COMPLETE (UACMP) WITH MICROSCOPIC
Bacteria, UA: NONE SEEN
Bilirubin Urine: NEGATIVE
Glucose, UA: NEGATIVE mg/dL
Ketones, ur: NEGATIVE mg/dL
Nitrite: NEGATIVE
Protein, ur: 30 mg/dL — AB
Specific Gravity, Urine: 1.013 (ref 1.005–1.030)
pH: 5 (ref 5.0–8.0)

## 2021-05-13 LAB — CBC
HCT: 28.4 % — ABNORMAL LOW (ref 36.0–46.0)
Hemoglobin: 9.4 g/dL — ABNORMAL LOW (ref 12.0–15.0)
MCH: 29 pg (ref 26.0–34.0)
MCHC: 33.1 g/dL (ref 30.0–36.0)
MCV: 87.7 fL (ref 80.0–100.0)
Platelets: 344 10*3/uL (ref 150–400)
RBC: 3.24 MIL/uL — ABNORMAL LOW (ref 3.87–5.11)
RDW: 14.6 % (ref 11.5–15.5)
WBC: 28.6 10*3/uL — ABNORMAL HIGH (ref 4.0–10.5)
nRBC: 0 % (ref 0.0–0.2)

## 2021-05-13 LAB — RESP PANEL BY RT-PCR (FLU A&B, COVID) ARPGX2
Influenza A by PCR: NEGATIVE
Influenza B by PCR: NEGATIVE
SARS Coronavirus 2 by RT PCR: NEGATIVE

## 2021-05-13 LAB — COMPREHENSIVE METABOLIC PANEL
ALT: 24 U/L (ref 0–44)
AST: 16 U/L (ref 15–41)
Albumin: 2.3 g/dL — ABNORMAL LOW (ref 3.5–5.0)
Alkaline Phosphatase: 365 U/L — ABNORMAL HIGH (ref 38–126)
Anion gap: 6 (ref 5–15)
BUN: 18 mg/dL (ref 6–20)
CO2: 26 mmol/L (ref 22–32)
Calcium: 8.8 mg/dL — ABNORMAL LOW (ref 8.9–10.3)
Chloride: 102 mmol/L (ref 98–111)
Creatinine, Ser: 1.18 mg/dL — ABNORMAL HIGH (ref 0.44–1.00)
GFR, Estimated: >60 mL/min (ref >60)
Glucose, Bld: 112 mg/dL — ABNORMAL HIGH (ref 70–99)
Potassium: 4.5 mmol/L (ref 3.5–5.1)
Sodium: 134 mmol/L — ABNORMAL LOW (ref 135–145)
Total Bilirubin: 0.9 mg/dL (ref 0.3–1.2)
Total Protein: 5.9 g/dL — ABNORMAL LOW (ref 6.5–8.1)

## 2021-05-13 LAB — PROCALCITONIN
Procalcitonin: 1.25 ng/mL
Procalcitonin: 1.42 ng/mL

## 2021-05-13 LAB — TROPONIN I (HIGH SENSITIVITY): Troponin I (High Sensitivity): 16 ng/L (ref <18)

## 2021-05-13 LAB — LACTIC ACID, PLASMA: Lactic Acid, Venous: 1.4 mmol/L (ref 0.5–1.9)

## 2021-05-13 LAB — PROTIME-INR
INR: 1.2 (ref 0.8–1.2)
Prothrombin Time: 15.6 seconds — ABNORMAL HIGH (ref 11.4–15.2)

## 2021-05-13 LAB — LIPASE, BLOOD: Lipase: 19 U/L (ref 11–51)

## 2021-05-13 LAB — HIV ANTIBODY (ROUTINE TESTING W REFLEX): HIV Screen 4th Generation wRfx: NONREACTIVE

## 2021-05-13 LAB — CORTISOL-AM, BLOOD: Cortisol - AM: 11.8 ug/dL (ref 6.7–22.6)

## 2021-05-13 MED ORDER — MORPHINE SULFATE (PF) 2 MG/ML IV SOLN
2.0000 mg | INTRAVENOUS | Status: DC | PRN
  Administered 2021-05-13 – 2021-05-15 (×15): 2 mg via INTRAVENOUS
  Filled 2021-05-13 (×15): qty 1

## 2021-05-13 MED ORDER — ONDANSETRON HCL 4 MG/2ML IJ SOLN: 4.0000 mg | Freq: Four times a day (QID) | INTRAMUSCULAR | Status: DC | PRN

## 2021-05-13 MED ORDER — ACETAMINOPHEN 325 MG PO TABS
650.0000 mg | ORAL_TABLET | Freq: Four times a day (QID) | ORAL | Status: DC | PRN
  Administered 2021-05-13: 650 mg via ORAL
  Filled 2021-05-13: qty 2

## 2021-05-13 MED ORDER — VANCOMYCIN HCL IN DEXTROSE 1-5 GM/200ML-% IV SOLN
1000.0000 mg | Freq: Once | INTRAVENOUS | Status: AC
  Administered 2021-05-13: 05:00:00 1000 mg via INTRAVENOUS
  Filled 2021-05-13: qty 200

## 2021-05-13 MED ORDER — LACTATED RINGERS IV BOLUS (SEPSIS)
250.0000 mL | Freq: Once | INTRAVENOUS | Status: AC
  Administered 2021-05-13: 250 mL via INTRAVENOUS

## 2021-05-13 MED ORDER — ACETAMINOPHEN 650 MG RE SUPP
650.0000 mg | Freq: Four times a day (QID) | RECTAL | Status: DC | PRN
  Filled 2021-05-13: qty 1

## 2021-05-13 MED ORDER — ONDANSETRON HCL 4 MG/2ML IJ SOLN
4.0000 mg | Freq: Once | INTRAMUSCULAR | Status: AC
  Administered 2021-05-13: 01:00:00 4 mg via INTRAVENOUS
  Filled 2021-05-13: qty 2

## 2021-05-13 MED ORDER — FENTANYL CITRATE (PF) 100 MCG/2ML IJ SOLN
50.0000 ug | Freq: Once | INTRAMUSCULAR | Status: AC
  Administered 2021-05-13: 50 ug via INTRAVENOUS
  Filled 2021-05-13: qty 2

## 2021-05-13 MED ORDER — SODIUM CHLORIDE 0.9 % IV SOLN: INTRAVENOUS | Status: DC

## 2021-05-13 MED ORDER — CEFTRIAXONE SODIUM 2 G IJ SOLR
2.0000 g | INTRAMUSCULAR | Status: DC
Start: 2021-05-13 — End: 2021-05-15
  Administered 2021-05-13 – 2021-05-15 (×3): 2 g via INTRAVENOUS
  Filled 2021-05-13: qty 2
  Filled 2021-05-13 (×3): qty 20

## 2021-05-13 MED ORDER — ACETAMINOPHEN 500 MG PO TABS
1000.0000 mg | ORAL_TABLET | Freq: Once | ORAL | Status: AC
  Administered 2021-05-13: 02:00:00 1000 mg via ORAL
  Filled 2021-05-13: qty 2

## 2021-05-13 MED ORDER — KETOROLAC TROMETHAMINE 30 MG/ML IJ SOLN
15.0000 mg | Freq: Once | INTRAMUSCULAR | Status: AC
  Administered 2021-05-13: 02:00:00 15 mg via INTRAVENOUS

## 2021-05-13 MED ORDER — ENOXAPARIN SODIUM 40 MG/0.4ML IJ SOSY: 40.0000 mg | PREFILLED_SYRINGE | INTRAMUSCULAR | Status: DC

## 2021-05-13 MED ORDER — CALCIUM CARBONATE ANTACID 500 MG PO CHEW
1.0000 | CHEWABLE_TABLET | Freq: Once | ORAL | Status: AC
  Administered 2021-05-13: 200 mg via ORAL
  Filled 2021-05-13: qty 1

## 2021-05-13 MED ORDER — LACTATED RINGERS IV SOLN: INTRAVENOUS | Status: AC

## 2021-05-13 MED ORDER — CALCIUM CARBONATE ANTACID 500 MG PO CHEW: 1.0000 | CHEWABLE_TABLET | Freq: Four times a day (QID) | ORAL | Status: DC | PRN

## 2021-05-13 MED ORDER — SODIUM CHLORIDE 0.9 % IV SOLN
1.0000 g | Freq: Once | INTRAVENOUS | Status: AC
  Administered 2021-05-13: 04:00:00 1 g via INTRAVENOUS
  Filled 2021-05-13: qty 1

## 2021-05-13 MED ORDER — LACTATED RINGERS IV BOLUS
1000.0000 mL | Freq: Once | INTRAVENOUS | Status: AC
  Administered 2021-05-13: 01:00:00 1000 mL via INTRAVENOUS

## 2021-05-13 MED ORDER — ENOXAPARIN SODIUM 40 MG/0.4ML IJ SOSY
40.0000 mg | PREFILLED_SYRINGE | INTRAMUSCULAR | Status: DC
  Administered 2021-05-13 – 2021-05-14 (×2): 40 mg via SUBCUTANEOUS
  Filled 2021-05-13 (×4): qty 0.4

## 2021-05-13 MED ORDER — METRONIDAZOLE 500 MG/100ML IV SOLN
500.0000 mg | Freq: Three times a day (TID) | INTRAVENOUS | Status: DC
  Administered 2021-05-13 – 2021-05-14 (×4): 500 mg via INTRAVENOUS
  Filled 2021-05-13 (×6): qty 100

## 2021-05-13 MED ORDER — KETOROLAC TROMETHAMINE 30 MG/ML IJ SOLN
15.0000 mg | Freq: Once | INTRAMUSCULAR | Status: AC
  Administered 2021-05-13: 01:00:00 15 mg via INTRAVENOUS
  Filled 2021-05-13: qty 1

## 2021-05-13 MED ORDER — LACTATED RINGERS IV BOLUS (SEPSIS)
1000.0000 mL | Freq: Once | INTRAVENOUS | Status: AC
  Administered 2021-05-13: 1000 mL via INTRAVENOUS

## 2021-05-13 MED ORDER — HYDROCODONE-ACETAMINOPHEN 5-325 MG PO TABS
1.0000 | ORAL_TABLET | ORAL | Status: DC | PRN
  Administered 2021-05-13 – 2021-05-15 (×9): 2 via ORAL
  Filled 2021-05-13 (×9): qty 2

## 2021-05-13 MED ORDER — ONDANSETRON HCL 4 MG PO TABS: 4.0000 mg | ORAL_TABLET | Freq: Four times a day (QID) | ORAL | Status: DC | PRN

## 2021-05-13 NOTE — ED Provider Notes (Signed)
[facility] Emergency Department Provider Note  ____________________________________________  Time seen: Approximately 1:10 AM  I have reviewed the triage vital signs and the nursing notes.   HISTORY  Chief Complaint Flank Pain   HPI Shelly Munoz is a 40 y.o. female who presents for evaluation of right flank pain.  Patient reports 2 days sharp constant right flank and right-sided abdominal pain associated with nausea and chills.  Patient has been vomiting and has been unable to keep anything down.  She denies diarrhea or constipation, chest pain or shortness of breath, dysuria or hematuria, vaginal discharge.  She reports that she was sick with a upper respiratory infection 2-3 weeks ago and has a little bit of a lingering cough.  Denies any personal or family history of blood clots, recent travel immobilization, leg pain or swelling, hemoptysis or exogenous hormones.  Past Medical History:  Diagnosis Date   Chronic pain    Hypertension    Nephrolithiasis    Vitamin D deficiency     There are no problems to display for this patient.   Past Surgical History:  Procedure Laterality Date   ABDOMINAL HYSTERECTOMY     CESAREAN SECTION     TUBAL LIGATION      Prior to Admission medications   Medication Sig Start Date End Date Taking? Authorizing Provider  cholecalciferol (VITAMIN D) 1000 UNITS tablet Take 1,000 Units by mouth.    [provider]  HYDROcodone-acetaminophen (NORCO) 5-325 MG per tablet Take 1 tablet by mouth every 4 (four) hours as needed for moderate pain. 04/20/15   Darien Ramus, MD  omeprazole (PRILOSEC) 40 MG capsule Take 1 capsule (40 mg total) by mouth daily. 04/20/15 04/19/16  Darien Ramus, MD    Allergies Flomax [tamsulosin hcl], Toradol [ketorolac tromethamine], Ketorolac, and Pyridium  [phenazopyridine hcl]  No family history on file.  Social History Social History   Tobacco Use   Smoking status: Every  Day    Packs/day: 0.50    Years: 5.00    Pack years: 2.50    Types: Cigarettes   Smokeless tobacco: Never  Substance Use Topics   Alcohol use: No   Drug use: No    Review of Systems  Constitutional: Negative for fever. + chills Eyes: Negative for visual changes. ENT: Negative for sore throat. Neck: No neck pain  Cardiovascular: Negative for chest pain. Respiratory: Negative for shortness of breath. + cough Gastrointestinal: + R sided abdominal pain and nausea. no vomiting or diarrhea. Genitourinary: Negative for dysuria. Musculoskeletal: Negative for back pain. Skin: Negative for rash. Neurological: Negative for headaches, weakness or numbness. Psych: No SI or HI  ____________________________________________   PHYSICAL EXAM:  VITAL SIGNS: ED Triage Vitals [05/12/21 2128]  Enc Vitals Group     BP 110/67     Pulse Rate 99     Resp 14     Temp 99.5 F (37.5 C)     Temp Source Oral     SpO2 99 %     Weight      Height      Head Circumference      Peak Flow      Pain Score 7     Pain Loc      Pain Edu?      Excl. in GC?     Constitutional: Alert and oriented. Well appearing and in no apparent distress. HEENT:      Head: Normocephalic and atraumatic.  Eyes: Conjunctivae are normal. Sclera is non-icteric.       Mouth/Throat: Mucous membranes are moist.       Neck: Supple with no signs of meningismus. Cardiovascular: Regular rate and rhythm. No murmurs, gallops, or rubs. 2+ symmetrical distal pulses are present in all extremities. No JVD. Respiratory: Normal respiratory effort. Lungs are clear to auscultation bilaterally.  Gastrointestinal: Soft, diffusely tender on the right quadrants with no rebound or guarding  Genitourinary: R CVA tenderness. Musculoskeletal:  No edema, cyanosis, or erythema of extremities. Neurologic: Normal speech and language. Face is symmetric. Moving all extremities. No gross focal neurologic deficits are appreciated. Skin: Skin  is warm, dry and intact. No rash noted. Psychiatric: Mood and affect are normal. Speech and behavior are normal.  ____________________________________________   LABS (all labs ordered are listed, but only abnormal results are displayed)  Labs Reviewed  CBC WITH DIFFERENTIAL/PLATELET - Abnormal; Notable for the following components:      Result Value   WBC 23.1 (*)    Hemoglobin 11.1 (*)    HCT 34.0 (*)    Platelets 425 (*)    Neutro Abs 20.7 (*)    Abs Immature Granulocytes 0.28 (*)    All other components within normal limits  COMPREHENSIVE METABOLIC PANEL - Abnormal; Notable for the following components:   Sodium 133 (*)    Glucose, Bld 169 (*)    Creatinine, Ser 1.20 (*)    Albumin 2.7 (*)    Alkaline Phosphatase 430 (*)    GFR, Estimated 59 (*)    All other components within normal limits  URINALYSIS, COMPLETE (UACMP) WITH MICROSCOPIC - Abnormal; Notable for the following components:   Color, Urine YELLOW (*)    APPearance HAZY (*)    Hgb urine dipstick MODERATE (*)    Protein, ur 30 (*)    Leukocytes,Ua MODERATE (*)    All other components within normal limits  RESP PANEL BY RT-PCR (FLU A&B, COVID) ARPGX2  CULTURE, BLOOD (ROUTINE X 2)  CULTURE, BLOOD (ROUTINE X 2)  URINE CULTURE  LIPASE, BLOOD  PROCALCITONIN  LACTIC ACID, PLASMA  TROPONIN I (HIGH SENSITIVITY)   ____________________________________________  EKG  ED ECG REPORT I, Nita Sickle, the attending physician, personally viewed and interpreted this ECG.  Sinus tachycardia, rate of 109, normal intervals, normal axis, no ST elevations or depressions ____________________________________________  RADIOLOGY  I have personally reviewed the images performed during this visit and I agree with the Radiologist's read.   Interpretation by Radiologist:  DG Chest 2 View  Result Date: 05/13/2021 CLINICAL DATA:  Leukocytosis, fever, right flank pain for several days EXAM: CHEST - 2 VIEW COMPARISON:  None.  FINDINGS: No consolidation, features of edema, pneumothorax, or effusion. Pulmonary vascularity is normally distributed. The cardiomediastinal contours are unremarkable. No acute osseous or soft tissue abnormality. IMPRESSION: No acute cardiopulmonary abnormality. Electronically Signed   By: Kreg Shropshire M.D.   On: 05/13/2021 02:05   CT Renal Stone Study  Result Date: 05/12/2021 CLINICAL DATA:  40 year old female with flank pain. Concern for kidney stone. EXAM: CT ABDOMEN AND PELVIS WITHOUT CONTRAST TECHNIQUE: Multidetector CT imaging of the abdomen and pelvis was performed following the standard protocol without IV contrast. COMPARISON:  CT abdomen pelvis dated 04/02/2017. FINDINGS: Evaluation of this exam is limited in the absence of intravenous contrast. Lower chest: The visualized lung bases are clear. No intra-abdominal free air. Trace free fluid in the pelvis. Hepatobiliary: There is fatty infiltration of the liver. The liver is mildly enlarged.  Correlation with LFTs recommended to evaluate for possibility of steatohepatitis. No intrahepatic biliary dilatation. The gallbladder is unremarkable. Pancreas: Unremarkable. No pancreatic ductal dilatation or surrounding inflammatory changes. Spleen: Normal in size without focal abnormality. Adrenals/Urinary Tract: The adrenal glands unremarkable. There is no hydronephrosis or nephrolithiasis on either side. The visualized ureters and urinary bladder appear unremarkable. Stomach/Bowel: There is no bowel obstruction or active inflammation. The appendix is normal. Vascular/Lymphatic: The abdominal aorta and IVC are grossly unremarkable on this noncontrast CT. No portal venous gas. There is no adenopathy. Reproductive: Hysterectomy. No adnexal masses. Other: None Musculoskeletal: No acute or significant osseous findings. IMPRESSION: 1. No acute intra-abdominal or pelvic pathology. No hydronephrosis or nephrolithiasis. 2. Fatty liver with mild hepatomegaly.  Correlation with LFTs recommended to evaluate for possibility of steatohepatitis. 3. No bowel obstruction. Normal appendix. Electronically Signed   By: Elgie CollardArash  Radparvar M.D.   On: 05/12/2021 23:36     ____________________________________________   PROCEDURES  Procedure(s) performed:yes .1-3 Lead EKG Interpretation  Date/Time: 05/13/2021 1:53 AM Performed by: Nita SickleVeronese, Erick, MD Authorized by: Nita SickleVeronese, , MD     Interpretation: non-specific     ECG rate assessment: tachycardic     Rhythm: sinus tachycardia     Ectopy: none     Conduction: normal     Critical Care performed: yes  CRITICAL CARE Performed by: Nita Sicklearolina Demika Langenderfer  ?  Total critical care time: 30 min  Critical care time was exclusive of separately billable procedures and treating other patients.  Critical care was necessary to treat or prevent imminent or life-threatening deterioration.  Critical care was time spent personally by me on the following activities: development of treatment plan with patient and/or surrogate as well as nursing, discussions with consultants, evaluation of patient's response to treatment, examination of patient, obtaining history from patient or surrogate, ordering and performing treatments and interventions, ordering and review of laboratory studies, ordering and review of radiographic studies, pulse oximetry and re-evaluation of patient's condition.  ____________________________________________   INITIAL IMPRESSION / ASSESSMENT AND PLAN / ED COURSE  40 y.o. female who presents for evaluation of right flank pain and right-sided abdominal pain, chills, nausea and vomiting for 2 days.  During my evaluation patient found to be febrile with a temp of 101F orally, tachycardic with a pulse of 111, abdomen is diffusely tender on the right quadrants with right CVA tenderness but no rebound or guarding.  She has a mild cough but lungs are clear to auscultation.  Differential diagnosis  including pyelonephritis versus pneumonia versus COVID versus flu versus kidney stone versus diverticulitis versus appendicitis versus gallbladder pathology.  CT renal showing no signs of kidney stone or any other abnormality with patient's gallbladder or appendix.  UA showing some leuks but no bacteria or nitrite.  Patient denies vaginal discharge and declines a pelvic exam.  Lab work showing white count of 23.1 with a left shift.  Creatinine of 1.20, no significant electrolyte derangements, normal LFTs and lipase.  Procalcitonin, lactic acid, blood cultures are pending.  Will do COVID and flu swab.  We will get a chest x-ray.  Will give IV fluids, IV Toradol, IV Zofran, and Tylenol for symptom relief.  We will hold off on antibiotics until COVID swab is back.  Patient placed on telemetry for monitoring of cardiorespiratory status.    _________________________ 3:20 AM on 05/13/2021 ----------------------------------------- Lactic is normal.  Procalcitonin is elevated at 1.25.  Chest x-ray negative for pneumonia.  COVID and flu negative.  At this time patient meets  sepsis criteria.  Will start broad-spectrum antibiotics.  Cultures have been sent.  _____________________________________________ Please note:  Patient was evaluated in Emergency Department today for the symptoms described in the history of present illness. Patient was evaluated in the context of the global COVID-19 pandemic, which necessitated consideration that the patient might be at risk for infection with the SARS-CoV-2 virus that causes COVID-19. Institutional protocols and algorithms that pertain to the evaluation of patients at risk for COVID-19 are in a state of rapid change based on information released by regulatory bodies including the CDC and federal and state organizations. These policies and algorithms were followed during the patient's care in the ED.  Some ED evaluations and interventions may be delayed as a result of limited  staffing during the pandemic.   [place] Controlled Substance Database was reviewed by me. ____________________________________________   FINAL CLINICAL IMPRESSION(S) / ED DIAGNOSES   Final diagnoses:  Sepsis without acute organ dysfunction, due to unspecified organism [facility])      NEW MEDICATIONS STARTED DURING THIS VISIT:  ED Discharge Orders     None        Note:  This document was prepared using Dragon voice recognition software and may include unintentional dictation errors.    Don Perking, [place], MD 05/13/21 [phone]

## 2021-05-13 NOTE — ED Notes (Signed)
Pt reports right flank pain that is tender to palpation that has been going on for a few days.

## 2021-05-13 NOTE — ED Notes (Signed)
Pt taken to [facility] 

## 2021-05-13 NOTE — ED Notes (Signed)
Patient returned to ED from [facility]. Patient reportedly has received narcotic medication too recently for her procedure. Patient to receive her NM scan tomorrow. Patient aware.

## 2021-05-13 NOTE — ED Notes (Signed)
Patient transported to inpatient unit via wheelchair with transport.

## 2021-05-13 NOTE — Progress Notes (Signed)
PROGRESS NOTE    Shelly Munoz  IRJ:[id number] DOB: August 13, 1981 DOA: 05/13/2021 PCP: Laneta Simmers, NP (Inactive)   Brief Narrative:  Shelly Munoz is a 40 y.o. female with history of low back pain, and  nephrolithiasis who presents with RUQ pain radiating to back, associated with nausea, vomiting, fever and chills. Had upper respiratory tract symptoms a couple weeks ago, which has for the most part resolved except for residual cough denies chest pain or shortness of breath.  Initially met sepsis criteria with being febrile at 101, mild tachycardia, leukocytosis, elevated alkaline phosphatase, elevated procalcitonin, UA with moderate leukocyte esterase. CT renal stone studies without any acute abnormality. RUQ ultrasound with biliary sludge. HIDA scan ordered.  Subjective: Patient continued to have right upper quadrant pain along with some nausea.  Stating that she did not vomited after getting nausea medicine.  Patient was using ibuprofen for the past few weeks since started some upper respiratory symptoms.  Denies any hematemesis or melena.  Assessment & Plan:   Active Problems:   Sepsis ([place])   Right flank pain  Sepsis secondary to intra-abdominal infection.  Met sepsis criteria with fever, tachycardia and leukocytosis.  Suspecting intra-abdominal infection, can be an early pyelonephritis.  Elevated procalcitonin.  Initial blood cultures negative so far.  Urine cultures pending. -Continue with ceftriaxone and Flagyl -Continue with supportive care  Elevated alkaline phosphatase.  Rest of the liver enzymes within normal limit.  RUQ ultrasound was obtained due to persistent right upper quadrant pain and tenderness which was positive for biliary sludge. -HIDA scan -Monitor liver function -Fractionated alkaline phosphatase  Objective: Vitals:   05/13/21 1019 05/13/21 1030 05/13/21 1100 05/13/21 1219  BP:  108/74 101/81   Pulse:  (!) 108 (!) 123   Resp:      Temp: 99.9 F  (37.7 C)   100.2 F (37.9 C)  TempSrc: Oral   Oral  SpO2:  96% 96%   Weight:      Height:       No intake or output data in the 24 hours ending 05/13/21 1450 Filed Weights   05/13/21 0802  Weight: 80 kg    Examination:  General exam: Appears calm and comfortable  Respiratory system: Clear to auscultation. Respiratory effort normal. Cardiovascular system: S1 & S2 heard, RRR.  Gastrointestinal system: Soft, RUQ tenderness, nondistended, bowel sounds positive. Central nervous system: Alert and oriented. No focal neurological deficits.Symmetric 5 x 5 power. Extremities: No edema, no cyanosis, pulses intact and symmetrical. Psychiatry: Judgement and insight appear normal. Mood & affect appropriate.    DVT prophylaxis: Lovenox Code Status: Full Family Communication: Husband was updated at bedside Disposition Plan:  Status is: Inpatient  Remains inpatient appropriate because:Inpatient level of care appropriate due to severity of illness  Dispo: The patient is from: Home              Anticipated d/c is to: Home              Patient currently is not medically stable to d/c.   Difficult to place patient No               Level of care: Med-Surg  All the records are reviewed and case discussed with Care Management/Social Worker. Management plans discussed with the patient, nursing and they are in agreement.  Consultants:  None  Procedures:  Antimicrobials:  Ceftriaxone Flagyl  Data Reviewed: I have personally reviewed following labs and imaging studies  CBC: Recent Labs  Lab 05/12/21  2130 05/13/21 0452  WBC 23.1* 28.6*  NEUTROABS 20.7*  --   HGB 11.1* 9.4*  HCT 34.0* 28.4*  MCV 86.5 87.7  PLT 425* 734   Basic Metabolic Panel: Recent Labs  Lab 05/12/21 2130 05/13/21 0452  NA 133* 134*  K 4.3 4.5  CL 98 102  CO2 26 26  GLUCOSE 169* 112*  BUN 16 18  CREATININE 1.20* 1.18*  CALCIUM 9.0 8.8*   GFR: Estimated Creatinine Clearance: 61.2 mL/min (A) (by C-G  formula based on SCr of 1.18 mg/dL (H)). Liver Function Tests: Recent Labs  Lab 05/12/21 2130 05/13/21 0452  AST 22 16  ALT 29 24  ALKPHOS 430* 365*  BILITOT 0.7 0.9  PROT 6.8 5.9*  ALBUMIN 2.7* 2.3*   Recent Labs  Lab 05/12/21 2130  LIPASE 19   No results for input(s): AMMONIA in the last 168 hours. Coagulation Profile: Recent Labs  Lab 05/13/21 0452  INR 1.2   Cardiac Enzymes: No results for input(s): CKTOTAL, CKMB, CKMBINDEX, TROPONINI in the last 168 hours. BNP (last 3 results) No results for input(s): PROBNP in the last 8760 hours. HbA1C: No results for input(s): HGBA1C in the last 72 hours. CBG: No results for input(s): GLUCAP in the last 168 hours. Lipid Profile: No results for input(s): CHOL, HDL, LDLCALC, TRIG, CHOLHDL, LDLDIRECT in the last 72 hours. Thyroid Function Tests: No results for input(s): TSH, T4TOTAL, FREET4, T3FREE, THYROIDAB in the last 72 hours. Anemia Panel: No results for input(s): VITAMINB12, FOLATE, FERRITIN, TIBC, IRON, RETICCTPCT in the last 72 hours. Sepsis Labs: Recent Labs  Lab 05/12/21 2130 05/13/21 0228 05/13/21 0452  PROCALCITON 1.25  --  1.42  LATICACIDVEN  --  1.4  --     Recent Results (from the past 240 hour(s))  Blood culture (routine x 2)     Status: None (Preliminary result)   Collection Time: 05/13/21  2:28 AM   Specimen: BLOOD  Result Value Ref Range Status   Specimen Description BLOOD RIGHT ASSIST CONTROL  Final   Special Requests   Final    BOTTLES DRAWN AEROBIC AND ANAEROBIC Blood Culture adequate volume   Culture   Final    NO GROWTH < 12 HOURS Performed at [facility], [address] [place], [place] [address]    Report Status PENDING  Incomplete  Blood culture (routine x 2)     Status: None (Preliminary result)   Collection Time: 05/13/21  2:28 AM   Specimen: BLOOD  Result Value Ref Range Status   Specimen Description BLOOD LEFT HAND  Final   Special Requests   Final    BOTTLES DRAWN  AEROBIC AND ANAEROBIC Blood Culture adequate volume   Culture   Final    NO GROWTH < 12 HOURS Performed at [facility], [address] [place],  [address]    Report Status PENDING  Incomplete  Resp Panel by RT-PCR (Flu A&B, Covid) Nasopharyngeal Swab     Status: None   Collection Time: 05/13/21  2:28 AM   Specimen: Nasopharyngeal Swab; Nasopharyngeal(NP) swabs in vial transport medium  Result Value Ref Range Status   SARS Coronavirus 2 by RT PCR NEGATIVE NEGATIVE Final    Comment: (NOTE) SARS-CoV-2 target nucleic acids are NOT DETECTED.  The SARS-CoV-2 RNA is generally detectable in upper respiratory specimens during the acute phase of infection. The lowest concentration of SARS-CoV-2 viral copies this assay can detect is 138 copies/mL. A negative result does not preclude SARS-Cov-2 infection and should  not be used as the sole basis for treatment or other patient management decisions. A negative result may occur with  improper specimen collection/handling, submission of specimen other than nasopharyngeal swab, presence of viral mutation(s) within the areas targeted by this assay, and inadequate number of viral copies(<138 copies/mL). A negative result must be combined with clinical observations, patient history, and epidemiological information. The expected result is Negative.  Fact Sheet for Patients:  EntrepreneurPulse.com.au  Fact Sheet for Healthcare Providers:  IncredibleEmployment.be  This test is no t yet approved or cleared by the [place] FDA and  has been authorized for detection and/or diagnosis of SARS-CoV-2 by FDA under an Emergency Use Authorization (EUA). This EUA will remain  in effect (meaning this test can be used) for the duration of the COVID-19 declaration under Section 564(b)(1) of the Act, 21 U.S.C.section 360bbb-3(b)(1), unless the authorization is terminated  or revoked sooner.        Influenza A by PCR NEGATIVE NEGATIVE Final   Influenza B by PCR NEGATIVE NEGATIVE Final    Comment: (NOTE) The Xpert Xpress SARS-CoV-2/FLU/RSV plus assay is intended as an aid in the diagnosis of influenza from Nasopharyngeal swab specimens and should not be used as a sole basis for treatment. Nasal washings and aspirates are unacceptable for Xpert Xpress SARS-CoV-2/FLU/RSV testing.  Fact Sheet for Patients: EntrepreneurPulse.com.au  Fact Sheet for Healthcare Providers: IncredibleEmployment.be  This test is not yet approved or cleared by the [place] FDA and has been authorized for detection and/or diagnosis of SARS-CoV-2 by FDA under an Emergency Use Authorization (EUA). This EUA will remain in effect (meaning this test can be used) for the duration of the COVID-19 declaration under Section 564(b)(1) of the Act, 21 U.S.C. section 360bbb-3(b)(1), unless the authorization is terminated or revoked.  Performed at [facility], [address] [place], [place] [address]      Radiology Studies: DG Chest 2 View  Result Date: 05/13/2021 CLINICAL DATA:  Leukocytosis, fever, right flank pain for several days EXAM: CHEST - 2 VIEW COMPARISON:  None. FINDINGS: No consolidation, features of edema, pneumothorax, or effusion. Pulmonary vascularity is normally distributed. The cardiomediastinal contours are unremarkable. No acute osseous or soft tissue abnormality. IMPRESSION: No acute cardiopulmonary abnormality. Electronically Signed   By: Lovena Le M.D.   On: 05/13/2021 02:05   CT Renal Stone Study  Result Date: 05/12/2021 CLINICAL DATA:  40 year old female with flank pain. Concern for kidney stone. EXAM: CT ABDOMEN AND PELVIS WITHOUT CONTRAST TECHNIQUE: Multidetector CT imaging of the abdomen and pelvis was performed following the standard protocol without IV contrast. COMPARISON:  CT abdomen pelvis dated 04/02/2017. FINDINGS: Evaluation of  this exam is limited in the absence of intravenous contrast. Lower chest: The visualized lung bases are clear. No intra-abdominal free air. Trace free fluid in the pelvis. Hepatobiliary: There is fatty infiltration of the liver. The liver is mildly enlarged. Correlation with LFTs recommended to evaluate for possibility of steatohepatitis. No intrahepatic biliary dilatation. The gallbladder is unremarkable. Pancreas: Unremarkable. No pancreatic ductal dilatation or surrounding inflammatory changes. Spleen: Normal in size without focal abnormality. Adrenals/Urinary Tract: The adrenal glands unremarkable. There is no hydronephrosis or nephrolithiasis on either side. The visualized ureters and urinary bladder appear unremarkable. Stomach/Bowel: There is no bowel obstruction or active inflammation. The appendix is normal. Vascular/Lymphatic: The abdominal aorta and IVC are grossly unremarkable on this noncontrast CT. No portal venous gas. There is no adenopathy. Reproductive: Hysterectomy. No adnexal masses. Other: None Musculoskeletal: No acute or  significant osseous findings. IMPRESSION: 1. No acute intra-abdominal or pelvic pathology. No hydronephrosis or nephrolithiasis. 2. Fatty liver with mild hepatomegaly. Correlation with LFTs recommended to evaluate for possibility of steatohepatitis. 3. No bowel obstruction. Normal appendix. Electronically Signed   By: Anner Crete M.D.   On: 05/12/2021 23:36   US Abdomen Limited RUQ (LIVER/GB)  Result Date: 05/13/2021 CLINICAL DATA:  Provided history: Right upper quadrant pain, elevated alkaline phosphatase. EXAM: ULTRASOUND ABDOMEN LIMITED RIGHT UPPER QUADRANT COMPARISON:  CT abdomen/pelvis 05/12/2021. FINDINGS: Gallbladder: Gallbladder sludge is questioned. No gallstones or gallbladder wall thickening identified. No sonographic Percell Miller sign is elicited by the scanning technologist. Common bile duct: Diameter: 5-6 mm, within normal limits. Liver: No focal lesion  identified. Within normal limits in parenchymal echogenicity. Portal vein is patent on color Doppler imaging with normal direction of blood flow towards the liver. IMPRESSION: Possible gallbladder sludge. Hepatomegaly was better appreciated on the recent prior CT abdomen/pelvis of 05/12/2021. Otherwise unremarkable right upper quadrant ultrasound, as described. Electronically Signed   By: Kellie Simmering DO   On: 05/13/2021 12:15    Scheduled Meds:  enoxaparin (LOVENOX) injection  40 mg Subcutaneous Q24H   Continuous Infusions:  sodium chloride 125 mL/hr at 05/13/21 0702   cefTRIAXone (ROCEPHIN)  IV Stopped (05/13/21 0902)   lactated ringers     metronidazole 500 mg (05/13/21 1230)     LOS: 0 days   Time spent: 40 minutes More than 50% of the time was spent in counseling/coordination of care  Lorella Nimrod, MD [facility]  If 7PM-7AM, please contact night-coverage [email or web address]  05/13/2021, 2:50 PM   This record has been created using Systems analyst. Errors have been sought and corrected,but may not always be located. Such creation errors do not reflect on the standard of care.

## 2021-05-13 NOTE — ED Notes (Signed)
Handoff to be provided to inpatient unit by ED RNs. Patient bed assigned to inpatient unit. Patient to be transported from [facility] to inpatient unit. Plan discussed with primary RN, patient, and [facility] staff.

## 2021-05-13 NOTE — H&P (Signed)
History and Physical    Shelly Munoz:[id number] DOB: Mar 19, 1981 DOA: 05/13/2021  PCP: Laneta Simmers, NP (Inactive)   Patient coming from: Home  I have personally briefly reviewed patient's old medical records in [place]  Chief Complaint: Right flank pain  HPI: Shelly Munoz is a 40 y.o. female with history of low back pain, and  nephrolithiasis who presents with right-sided flank pain associated with nausea, vomiting, fever and chills.   Pain is crampy of severe intensity, nonradiating.  Denies change in bowel habit or dysuria and denies vaginal discharge or abnormal vaginal bleeding.  Had upper respiratory tract symptoms a couple weeks ago, which has for the most part resolved except for residual cough denies chest pain or shortness of breath.  Denies leg pain or swelling.  ED Course: On arrival, low-grade temperature of 99.5 spiking as high as 101 while in the ED, borderline tachycardia at 99 with otherwise normal vitals.  Blood work significant for leukocytosis of 23,000 mildly elevated creatinine of 1.2, elevated alk phos of 430 with otherwise normal LFTs and lipase of 19.  Urinalysis with moderate leukocyte esterase.  Procalcitonin 1.25.  Lactic acid 1.4 and troponin of 16 EKG as reviewed by me : Sinus tachycardia at 109 with no acute ST-T wave changes Imaging: Chest x-ray with no acute cardiopulmonary abnormality.  CT renal stone study with no acute intra-abdominal or pelvic pathology  Patient started on sepsis protocol for sepsis of unknown source and hospitalist consulted for admission.  Review of Systems: As per HPI otherwise all other systems on review of systems negative.    Past Medical History:  Diagnosis Date   Chronic pain    Hypertension    Nephrolithiasis    Vitamin D deficiency     Past Surgical History:  Procedure Laterality Date   ABDOMINAL HYSTERECTOMY     CESAREAN SECTION     TUBAL LIGATION       reports that she has been smoking  cigarettes. She has a 2.50 pack-year smoking history. She has never used smokeless tobacco. She reports that she does not drink alcohol and does not use drugs.  Allergies  Allergen Reactions   Flomax [Tamsulosin Hcl] Anaphylaxis   Toradol [Ketorolac Tromethamine] Other (See Comments)    Pt states her very loopy and strange   Ketorolac     Other reaction(s): VOMITING   Pyridium  [Phenazopyridine Hcl]     Other reaction(s): VOMITING    History reviewed. No pertinent family history.    Prior to Admission medications   Medication Sig Start Date End Date Taking? Authorizing Provider  cholecalciferol (VITAMIN D) 1000 UNITS tablet Take 1,000 Units by mouth.    [provider]  HYDROcodone-acetaminophen (NORCO) 5-325 MG per tablet Take 1 tablet by mouth every 4 (four) hours as needed for moderate pain. 04/20/15   Ahmed Prima, MD  omeprazole (PRILOSEC) 40 MG capsule Take 1 capsule (40 mg total) by mouth daily. 04/20/15 04/19/16  Ahmed Prima, MD    Physical Exam: Vitals:   05/13/21 0120 05/13/21 0144 05/13/21 0200 05/13/21 0411  BP: 118/71  111/79   Pulse: (!) 108  (!) 111   Resp: 15  16   Temp:  (!) 101 F (38.3 C)  99.4 F (37.4 C)  TempSrc:  Oral  Oral  SpO2: 99%  98%      Vitals:   05/13/21 0120 05/13/21 0144 05/13/21 0200 05/13/21 0411  BP: 118/71  111/79   Pulse: Marland Kitchen)  108  (!) 111   Resp: 15  16   Temp:  (!) 101 F (38.3 C)  99.4 F (37.4 C)  TempSrc:  Oral  Oral  SpO2: 99%  98%       Constitutional: Alert and oriented x 3 .  In mild pain discomfort  HEENT:      Head: Normocephalic and atraumatic.         Eyes: PERLA, EOMI, Conjunctivae are normal. Sclera is non-icteric.       Mouth/Throat: Mucous membranes are moist.       Neck: Supple with no signs of meningismus. Cardiovascular: Regular rate and rhythm. No murmurs, gallops, or rubs. 2+ symmetrical distal pulses are present . No JVD. No LE edema Respiratory: Respiratory effort normal .Lungs  sounds clear bilaterally. No wheezes, crackles, or rhonchi.  Gastrointestinal: Tender right flank, upper and lower quadrants non distended with positive bowel sounds.  Genitourinary: No CVA tenderness. Musculoskeletal: Nontender with normal range of motion in all extremities. No cyanosis, or erythema of extremities. Neurologic:  Face is symmetric. Moving all extremities. No gross focal neurologic deficits . Skin: Skin is warm, dry.  No rash or ulcers Psychiatric: Mood and affect are normal    Labs on Admission: I have personally reviewed following labs and imaging studies  CBC: Recent Labs  Lab 05/12/21 2130  WBC 23.1*  NEUTROABS 20.7*  HGB 11.1*  HCT 34.0*  MCV 86.5  PLT 696*   Basic Metabolic Panel: Recent Labs  Lab 05/12/21 2130  NA 133*  K 4.3  CL 98  CO2 26  GLUCOSE 169*  BUN 16  CREATININE 1.20*  CALCIUM 9.0   GFR: CrCl cannot be calculated (Unknown ideal weight.). Liver Function Tests: Recent Labs  Lab 05/12/21 2130  AST 22  ALT 29  ALKPHOS 430*  BILITOT 0.7  PROT 6.8  ALBUMIN 2.7*   Recent Labs  Lab 05/12/21 2130  LIPASE 19   No results for input(s): AMMONIA in the last 168 hours. Coagulation Profile: No results for input(s): INR, PROTIME in the last 168 hours. Cardiac Enzymes: No results for input(s): CKTOTAL, CKMB, CKMBINDEX, TROPONINI in the last 168 hours. BNP (last 3 results) No results for input(s): PROBNP in the last 8760 hours. HbA1C: No results for input(s): HGBA1C in the last 72 hours. CBG: No results for input(s): GLUCAP in the last 168 hours. Lipid Profile: No results for input(s): CHOL, HDL, LDLCALC, TRIG, CHOLHDL, LDLDIRECT in the last 72 hours. Thyroid Function Tests: No results for input(s): TSH, T4TOTAL, FREET4, T3FREE, THYROIDAB in the last 72 hours. Anemia Panel: No results for input(s): VITAMINB12, FOLATE, FERRITIN, TIBC, IRON, RETICCTPCT in the last 72 hours. Urine analysis:    Component Value Date/Time   COLORURINE  YELLOW (A) 05/13/2021 0117   APPEARANCEUR HAZY (A) 05/13/2021 0117   APPEARANCEUR Clear 05/01/2012 0833   LABSPEC 1.013 05/13/2021 0117   LABSPEC 1.003 05/01/2012 0833   PHURINE 5.0 05/13/2021 0117   GLUCOSEU NEGATIVE 05/13/2021 0117   GLUCOSEU Negative 05/01/2012 0833   HGBUR MODERATE (A) 05/13/2021 0117   BILIRUBINUR NEGATIVE 05/13/2021 0117   BILIRUBINUR Negative 05/01/2012 0833   KETONESUR NEGATIVE 05/13/2021 0117   PROTEINUR 30 (A) 05/13/2021 0117   NITRITE NEGATIVE 05/13/2021 0117   LEUKOCYTESUR MODERATE (A) 05/13/2021 0117   LEUKOCYTESUR Negative 05/01/2012 0833    Radiological Exams on Admission: DG Chest 2 View  Result Date: 05/13/2021 CLINICAL DATA:  Leukocytosis, fever, right flank pain for several days EXAM: CHEST - 2 VIEW COMPARISON:  None. FINDINGS: No consolidation, features of edema, pneumothorax, or effusion. Pulmonary vascularity is normally distributed. The cardiomediastinal contours are unremarkable. No acute osseous or soft tissue abnormality. IMPRESSION: No acute cardiopulmonary abnormality. Electronically Signed   By: Lovena Le M.D.   On: 05/13/2021 02:05   CT Renal Stone Study  Result Date: 05/12/2021 CLINICAL DATA:  40 year old female with flank pain. Concern for kidney stone. EXAM: CT ABDOMEN AND PELVIS WITHOUT CONTRAST TECHNIQUE: Multidetector CT imaging of the abdomen and pelvis was performed following the standard protocol without IV contrast. COMPARISON:  CT abdomen pelvis dated 04/02/2017. FINDINGS: Evaluation of this exam is limited in the absence of intravenous contrast. Lower chest: The visualized lung bases are clear. No intra-abdominal free air. Trace free fluid in the pelvis. Hepatobiliary: There is fatty infiltration of the liver. The liver is mildly enlarged. Correlation with LFTs recommended to evaluate for possibility of steatohepatitis. No intrahepatic biliary dilatation. The gallbladder is unremarkable. Pancreas: Unremarkable. No pancreatic  ductal dilatation or surrounding inflammatory changes. Spleen: Normal in size without focal abnormality. Adrenals/Urinary Tract: The adrenal glands unremarkable. There is no hydronephrosis or nephrolithiasis on either side. The visualized ureters and urinary bladder appear unremarkable. Stomach/Bowel: There is no bowel obstruction or active inflammation. The appendix is normal. Vascular/Lymphatic: The abdominal aorta and IVC are grossly unremarkable on this noncontrast CT. No portal venous gas. There is no adenopathy. Reproductive: Hysterectomy. No adnexal masses. Other: None Musculoskeletal: No acute or significant osseous findings. IMPRESSION: 1. No acute intra-abdominal or pelvic pathology. No hydronephrosis or nephrolithiasis. 2. Fatty liver with mild hepatomegaly. Correlation with LFTs recommended to evaluate for possibility of steatohepatitis. 3. No bowel obstruction. Normal appendix. Electronically Signed   By: Anner Crete M.D.   On: 05/12/2021 23:36     Assessment/Plan    Sepsis ([place])   Right flank pain Elevated alkaline phosphatase - Patient with fever, tachycardia, leukocytosis and right flank pain - CT renal stone study unrevealing and chest x-ray clear, minimally abnormal UA - Suspecting possible early pyelonephritis.   - Follow-up repeat alk phos, lipase to determine whether to consider RUQ sonogram - For now we will treat as sepsis secondary to intra-abdominal source - Continue IV fluids, IV antibiotics  DVT prophylaxis: Lovenox  Code Status: full code  Family Communication:  none  Disposition Plan: Back to previous home environment Consults called: none  Status:At the time of admission, it appears that the appropriate admission status for this patient is INPATIENT. This is judged to be reasonable and necessary in order to provide the required intensity of service to ensure the patient's safety given the presenting symptoms, physical exam findings, and initial radiographic and  laboratory data in the context of their  Comorbid conditions.   Patient requires inpatient status due to high intensity of service, high risk for further deterioration and high frequency of surveillance required.   I certify that at the point of admission it is my clinical judgment that the patient will require inpatient hospital care spanning beyond [place] MD [facility]     05/13/2021, 4:42 AM

## 2021-05-13 NOTE — Progress Notes (Signed)
CODE SEPSIS - PHARMACY COMMUNICATION  **Broad Spectrum Antibiotics should be administered within 1 hour of Sepsis diagnosis**  Time Code Sepsis Called/Page Received: 0319  Antibiotics Ordered: Cefepime and Vancomycin  Time of 1st antibiotic administration: 0409  Additional action taken by pharmacy: Laretta Alstrom, RN @ [phone].  RN busy with other patients but will attempt to hang cefepime prior to 1 hour time limit.  Otelia Sergeant, PharmD, [facility] 05/13/2021 3:20 AM

## 2021-05-14 ENCOUNTER — Encounter: Payer: Self-pay | Admitting: Internal Medicine

## 2021-05-14 ENCOUNTER — Inpatient Hospital Stay: Payer: BC Managed Care – PPO

## 2021-05-14 LAB — CBC
HCT: 25.1 % — ABNORMAL LOW (ref 36.0–46.0)
Hemoglobin: 8.3 g/dL — ABNORMAL LOW (ref 12.0–15.0)
MCH: 28.7 pg (ref 26.0–34.0)
MCHC: 33.1 g/dL (ref 30.0–36.0)
MCV: 86.9 fL (ref 80.0–100.0)
Platelets: 320 10*3/uL (ref 150–400)
RBC: 2.89 MIL/uL — ABNORMAL LOW (ref 3.87–5.11)
RDW: 14.6 % (ref 11.5–15.5)
WBC: 21.9 10*3/uL — ABNORMAL HIGH (ref 4.0–10.5)
nRBC: 0 % (ref 0.0–0.2)

## 2021-05-14 LAB — COMPREHENSIVE METABOLIC PANEL
ALT: 20 U/L (ref 0–44)
AST: 19 U/L (ref 15–41)
Albumin: 2.1 g/dL — ABNORMAL LOW (ref 3.5–5.0)
Alkaline Phosphatase: 278 U/L — ABNORMAL HIGH (ref 38–126)
Anion gap: 8 (ref 5–15)
BUN: 13 mg/dL (ref 6–20)
CO2: 24 mmol/L (ref 22–32)
Calcium: 7.6 mg/dL — ABNORMAL LOW (ref 8.9–10.3)
Chloride: 103 mmol/L (ref 98–111)
Creatinine, Ser: 1.17 mg/dL — ABNORMAL HIGH (ref 0.44–1.00)
GFR, Estimated: >60 mL/min (ref >60)
Glucose, Bld: 82 mg/dL (ref 70–99)
Potassium: 3.5 mmol/L (ref 3.5–5.1)
Sodium: 135 mmol/L (ref 135–145)
Total Bilirubin: 0.9 mg/dL (ref 0.3–1.2)
Total Protein: 5.7 g/dL — ABNORMAL LOW (ref 6.5–8.1)

## 2021-05-14 LAB — PROCALCITONIN: Procalcitonin: 4.3 ng/mL

## 2021-05-14 MED ORDER — SODIUM CHLORIDE 0.9 % IV BOLUS
1000.0000 mL | Freq: Once | INTRAVENOUS | Status: AC
  Administered 2021-05-14: 1000 mL via INTRAVENOUS

## 2021-05-14 MED ORDER — TECHNETIUM TC 99M MEBROFENIN IV KIT: 5.0000 | PACK | Freq: Once | INTRAVENOUS | Status: DC | PRN

## 2021-05-14 MED ORDER — TECHNETIUM TC 99M MEBROFENIN IV KIT
5.4900 | PACK | Freq: Once | INTRAVENOUS | Status: AC | PRN
  Administered 2021-05-14: 5.49 via INTRAVENOUS

## 2021-05-14 NOTE — Progress Notes (Signed)
   05/14/21 0419  Assess: MEWS Score  Temp (!) 103 F (39.4 C)  BP (!) 99/58  Pulse Rate (!) 118  Assess: MEWS Score  MEWS Temp 2  MEWS Systolic 1  MEWS Pulse 2  MEWS RR 0  MEWS LOC 0  MEWS Score 5  MEWS Score Color Red  Assess: if the MEWS score is Yellow or Red  Were vital signs taken at a resting state? Yes  Focused Assessment Change from prior assessment (see assessment flowsheet)  Early Detection of Sepsis Score *See Row Information* Low  MEWS guidelines implemented *See Row Information* Yes  Treat  MEWS Interventions Administered prn meds/treatments  Pain Scale 0-10  Pain Score 0  Take Vital Signs  Increase Vital Sign Frequency  Red: Q 1hr X 4 then Q 4hr X 4, if remains red, continue Q 4hrs  Escalate  MEWS: Escalate Red: discuss with charge nurse/RN and provider, consider discussing with RRT  Notify: Charge Nurse/RN  Name of Charge Nurse/RN Notified Stacy,RN  Date Charge Nurse/RN Notified 05/14/21  Time Charge Nurse/RN Notified 0430  Document  Patient Outcome Stabilized after interventions

## 2021-05-14 NOTE — Progress Notes (Signed)
PROGRESS NOTE    Shelly Munoz  XBL:[id number] DOB: 1981/05/29 DOA: 05/13/2021 PCP: Laneta Simmers, NP (Inactive)   Brief Narrative:  Shelly Munoz is a 40 y.o. female with history of low back pain, and  nephrolithiasis who presents with RUQ pain radiating to back, associated with nausea, vomiting, fever and chills. Had upper respiratory tract symptoms a couple weeks ago, which has for the most part resolved except for residual cough denies chest pain or shortness of breath.  Initially met sepsis criteria with being febrile at 101, mild tachycardia, leukocytosis, elevated alkaline phosphatase, elevated procalcitonin, UA with moderate leukocyte esterase. CT renal stone studies without any acute abnormality. RUQ ultrasound with biliary sludge. HIDA scan was normal and did not show any biliary or hepatic problem. Urine cultures growing E. Coli.  Subjective: Patient continued to have some fever, still having right upper quadrant pain radiating to back, it was little more towards back today.  No nausea or vomiting.  Wants to eat regular food.  Assessment & Plan:   Active Problems:   Sepsis ([place])   Right flank pain  Sepsis secondary to intra-abdominal infection/UTI.  Met sepsis criteria with fever, tachycardia and leukocytosis.  Suspecting intra-abdominal infection, can be an early pyelonephritis.  Elevated procalcitonin.  Initial blood cultures negative so far.  Urine cultures growing E. coli.  CT renal studies did not show any sign of pyelonephritis. We will discontinue Flagyl as HIDA scan did not show any abnormality. -Continue with ceftriaxone and follow-up urine culture susceptibility results. -Continue with supportive care  Elevated alkaline phosphatase.  Improving.  Rest of the liver enzymes within normal limit.  RUQ ultrasound was obtained due to persistent right upper quadrant pain and tenderness which was positive for biliary sludge.  HIDA scan was negative for any biliary or  hepatic abnormality. -Monitor liver function  Objective: Vitals:   05/14/21 0521 05/14/21 0613 05/14/21 0800 05/14/21 1232  BP: 101/60 (!) 98/57 110/81 90/64  Pulse: (!) 123 (!) 101 88 (!) 113  Resp: (!) _0 Temp: (!) 101.3 F (38.5 C) 99.5 F (37.5 C) 98.8 F (37.1 C) 99.2 F (37.3 C)  TempSrc: Oral Oral Oral   SpO2: 100% 99% 95% 99%  Weight:      Height:        Intake/Output Summary (Last 24 hours) at 05/14/2021 1401 Last data filed at 05/14/2021 3007 Gross per 24 hour  Intake 2004.08 ml  Output 800 ml  Net 1204.08 ml   Filed Weights   05/13/21 0802  Weight: 80 kg    Examination:  General.  Well-developed lady, in no acute distress. Pulmonary.  Lungs clear bilaterally, normal respiratory effort. CV.  Regular rate and rhythm, no JVD, rub or murmur. Abdomen.  Soft, nontender, nondistended, BS positive. CNS.  Alert and oriented x3.  No focal neurologic deficit. Extremities.  No edema, no cyanosis, pulses intact and symmetrical. Psychiatry.  Judgment and insight appears normal.    DVT prophylaxis: Lovenox Code Status: Full Family Communication: Husband was updated at bedside Disposition Plan:  Status is: Inpatient  Remains inpatient appropriate because:Inpatient level of care appropriate due to severity of illness  Dispo: The patient is from: Home              Anticipated d/c is to: Home              Patient currently is not medically stable to d/c.   Difficult to place patient No  Level of care: Med-Surg  All the records are reviewed and case discussed with Care Management/Social Worker. Management plans discussed with the patient, nursing and they are in agreement.  Consultants:  None  Procedures:  Antimicrobials:  Ceftriaxone  Data Reviewed: I have personally reviewed following labs and imaging studies  CBC: Recent Labs  Lab 05/12/21 2130 05/13/21 0452 05/14/21 0458  WBC 23.1* 28.6* 21.9*  NEUTROABS 20.7*  --   --   HGB  11.1* 9.4* 8.3*  HCT 34.0* 28.4* 25.1*  MCV 86.5 87.7 86.9  PLT 425* 344 073    Basic Metabolic Panel: Recent Labs  Lab 05/12/21 2130 05/13/21 0452 05/14/21 0458  NA 133* 134* 135  K 4.3 4.5 3.5  CL 98 102 103  CO2 _0 GLUCOSE 169* 112* 82  BUN _1 CREATININE 1.20* 1.18* 1.17*  CALCIUM 9.0 8.8* 7.6*    GFR: Estimated Creatinine Clearance: 61.8 mL/min (A) (by C-G formula based on SCr of 1.17 mg/dL (H)). Liver Function Tests: Recent Labs  Lab 05/12/21 2130 05/13/21 0452 05/14/21 0458  AST _2 ALT _3 ALKPHOS 430* 365* 278*  BILITOT 0.7 0.9 0.9  PROT 6.8 5.9* 5.7*  ALBUMIN 2.7* 2.3* 2.1*    Recent Labs  Lab 05/12/21 2130  LIPASE 19    No results for input(s): AMMONIA in the last 168 hours. Coagulation Profile: Recent Labs  Lab 05/13/21 0452  INR 1.2    Cardiac Enzymes: No results for input(s): CKTOTAL, CKMB, CKMBINDEX, TROPONINI in the last 168 hours. BNP (last 3 results) No results for input(s): PROBNP in the last 8760 hours. HbA1C: No results for input(s): HGBA1C in the last 72 hours. CBG: No results for input(s): GLUCAP in the last 168 hours. Lipid Profile: No results for input(s): CHOL, HDL, LDLCALC, TRIG, CHOLHDL, LDLDIRECT in the last 72 hours. Thyroid Function Tests: No results for input(s): TSH, T4TOTAL, FREET4, T3FREE, THYROIDAB in the last 72 hours. Anemia Panel: No results for input(s): VITAMINB12, FOLATE, FERRITIN, TIBC, IRON, RETICCTPCT in the last 72 hours. Sepsis Labs: Recent Labs  Lab 05/12/21 2130 05/13/21 0228 05/13/21 0452 05/14/21 0458  PROCALCITON 1.25  --  1.42 4.30  LATICACIDVEN  --  1.4  --   --      Recent Results (from the past 240 hour(s))  Urine Culture     Status: Abnormal (Preliminary result)   Collection Time: 05/13/21  1:17 AM   Specimen: Urine, Random  Result Value Ref Range Status   Specimen Description   Final    URINE, RANDOM Performed at [facility], [address] [place], [place] [address]    Special Requests   Final    NONE Performed at [facility], [address] [place], [place] [address]    Culture (A)  Final    >=100,000 COLONIES/mL ESCHERICHIA COLI SUSCEPTIBILITIES TO FOLLOW Performed at [facility], [place] [address], [place], [place] [address]    Report Status PENDING  Incomplete  Blood culture (routine x 2)     Status: None (Preliminary result)   Collection Time: 05/13/21  2:28 AM   Specimen: BLOOD  Result Value Ref Range Status   Specimen Description BLOOD RIGHT ASSIST CONTROL  Final   Special Requests   Final    BOTTLES DRAWN AEROBIC AND ANAEROBIC Blood Culture adequate volume   Culture   Final    NO GROWTH 1 DAY Performed at [facility], [address] [place], [place] [address]    Report Status PENDING  Incomplete  Blood culture (routine x 2)     Status: None (Preliminary result)   Collection Time: 05/13/21  2:28 AM   Specimen: BLOOD  Result Value Ref Range Status   Specimen Description BLOOD LEFT HAND  Final   Special Requests   Final    BOTTLES DRAWN AEROBIC AND ANAEROBIC Blood Culture adequate volume   Culture   Final    NO GROWTH 1 DAY Performed at [facility], [address], [place], [place] [address]    Report Status PENDING  Incomplete  Resp Panel by RT-PCR (Flu A&B, Covid) Nasopharyngeal Swab     Status: None   Collection Time: 05/13/21  2:28 AM   Specimen: Nasopharyngeal Swab; Nasopharyngeal(NP) swabs in vial transport medium  Result Value Ref Range Status   SARS Coronavirus 2 by RT PCR NEGATIVE NEGATIVE Final    Comment: (NOTE) SARS-CoV-2 target nucleic acids are NOT DETECTED.  The SARS-CoV-2 RNA is generally detectable in upper respiratory specimens during the acute phase of infection. The lowest concentration of SARS-CoV-2 viral copies this assay can detect is 138 copies/mL. A negative result does not preclude SARS-Cov-2 infection and should not be used as the  sole basis for treatment or other patient management decisions. A negative result may occur with  improper specimen collection/handling, submission of specimen other than nasopharyngeal swab, presence of viral mutation(s) within the areas targeted by this assay, and inadequate number of viral copies(<138 copies/mL). A negative result must be combined with clinical observations, patient history, and epidemiological information. The expected result is Negative.  Fact Sheet for Patients:  EntrepreneurPulse.com.au  Fact Sheet for Healthcare Providers:  IncredibleEmployment.be  This test is no t yet approved or cleared by the [place] FDA and  has been authorized for detection and/or diagnosis of SARS-CoV-2 by FDA under an Emergency Use Authorization (EUA). This EUA will remain  in effect (meaning this test can be used) for the duration of the COVID-19 declaration under Section 564(b)(1) of the Act, 21 U.S.C.section 360bbb-3(b)(1), unless the authorization is terminated  or revoked sooner.       Influenza A by PCR NEGATIVE NEGATIVE Final   Influenza B by PCR NEGATIVE NEGATIVE Final    Comment: (NOTE) The Xpert Xpress SARS-CoV-2/FLU/RSV plus assay is intended as an aid in the diagnosis of influenza from Nasopharyngeal swab specimens and should not be used as a sole basis for treatment. Nasal washings and aspirates are unacceptable for Xpert Xpress SARS-CoV-2/FLU/RSV testing.  Fact Sheet for Patients: EntrepreneurPulse.com.au  Fact Sheet for Healthcare Providers: IncredibleEmployment.be  This test is not yet approved or cleared by the [place] FDA and has been authorized for detection and/or diagnosis of SARS-CoV-2 by FDA under an Emergency Use Authorization (EUA). This EUA will remain in effect (meaning this test can be used) for the duration of the COVID-19 declaration under Section 564(b)(1) of the  Act, 21 U.S.C. section 360bbb-3(b)(1), unless the authorization is terminated or revoked.  Performed at [facility], [address], [place], [place] [address]       Radiology Studies: DG Chest 2 View  Result Date: 05/13/2021 CLINICAL DATA:  Leukocytosis, fever, right flank pain for several days EXAM: CHEST - 2 VIEW COMPARISON:  None. FINDINGS: No consolidation, features of edema, pneumothorax, or effusion. Pulmonary vascularity is normally distributed. The cardiomediastinal contours are unremarkable. No acute osseous or soft tissue abnormality. IMPRESSION: No acute cardiopulmonary abnormality. Electronically Signed  By: Lovena Le M.D.   On: 05/13/2021 02:05   NM Hepato W/EF  Result Date: 05/14/2021 CLINICAL DATA:  Abdominal pain EXAM: NUCLEAR MEDICINE HEPATOBILIARY IMAGING WITH GALLBLADDER EF TECHNIQUE: Sequential images of the abdomen were obtained out to 60 minutes following intravenous administration of radiopharmaceutical. After oral ingestion of Ensure, gallbladder ejection fraction was determined. At 60 min, normal ejection fraction is greater than 33%. RADIOPHARMACEUTICALS:  5.49 mCi Tc-57m Choletec IV COMPARISON:  05/13/2021 FINDINGS: Prompt uptake and biliary excretion of activity by the liver is seen. Gallbladder activity is visualized, consistent with patency of cystic duct. Biliary activity passes into small bowel, consistent with patent common bile duct. Calculated gallbladder ejection fraction is 100%. (Normal gallbladder ejection fraction with Ensure is greater than 33%.) IMPRESSION: Normal uptake and excretion of biliary tracer. Normal gallbladder ejection fraction Electronically Signed   By: MInez CatalinaM.D.   On: 05/14/2021 13:33   CT Renal Stone Study  Result Date: 05/12/2021 CLINICAL DATA:  40year old female with flank pain. Concern for kidney stone. EXAM: CT ABDOMEN AND PELVIS WITHOUT CONTRAST TECHNIQUE: Multidetector CT imaging of the abdomen and pelvis  was performed following the standard protocol without IV contrast. COMPARISON:  CT abdomen pelvis dated 04/02/2017. FINDINGS: Evaluation of this exam is limited in the absence of intravenous contrast. Lower chest: The visualized lung bases are clear. No intra-abdominal free air. Trace free fluid in the pelvis. Hepatobiliary: There is fatty infiltration of the liver. The liver is mildly enlarged. Correlation with LFTs recommended to evaluate for possibility of steatohepatitis. No intrahepatic biliary dilatation. The gallbladder is unremarkable. Pancreas: Unremarkable. No pancreatic ductal dilatation or surrounding inflammatory changes. Spleen: Normal in size without focal abnormality. Adrenals/Urinary Tract: The adrenal glands unremarkable. There is no hydronephrosis or nephrolithiasis on either side. The visualized ureters and urinary bladder appear unremarkable. Stomach/Bowel: There is no bowel obstruction or active inflammation. The appendix is normal. Vascular/Lymphatic: The abdominal aorta and IVC are grossly unremarkable on this noncontrast CT. No portal venous gas. There is no adenopathy. Reproductive: Hysterectomy. No adnexal masses. Other: None Musculoskeletal: No acute or significant osseous findings. IMPRESSION: 1. No acute intra-abdominal or pelvic pathology. No hydronephrosis or nephrolithiasis. 2. Fatty liver with mild hepatomegaly. Correlation with LFTs recommended to evaluate for possibility of steatohepatitis. 3. No bowel obstruction. Normal appendix. Electronically Signed   By: AAnner CreteM.D.   On: 05/12/2021 23:36   UKoreaAbdomen Limited RUQ (LIVER/GB)  Result Date: 05/13/2021 CLINICAL DATA:  Provided history: Right upper quadrant pain, elevated alkaline phosphatase. EXAM: ULTRASOUND ABDOMEN LIMITED RIGHT UPPER QUADRANT COMPARISON:  CT abdomen/pelvis 05/12/2021. FINDINGS: Gallbladder: Gallbladder sludge is questioned. No gallstones or gallbladder wall thickening identified. No sonographic  MPercell Millersign is elicited by the scanning technologist. Common bile duct: Diameter: 5-6 mm, within normal limits. Liver: No focal lesion identified. Within normal limits in parenchymal echogenicity. Portal vein is patent on color Doppler imaging with normal direction of blood flow towards the liver. IMPRESSION: Possible gallbladder sludge. Hepatomegaly was better appreciated on the recent prior CT abdomen/pelvis of 05/12/2021. Otherwise unremarkable right upper quadrant ultrasound, as described. Electronically Signed   By: KKellie SimmeringDO   On: 05/13/2021 12:15    Scheduled Meds:  enoxaparin (LOVENOX) injection  40 mg Subcutaneous Q24H   Continuous Infusions:  sodium chloride 125 mL/hr at 05/14/21 0126   cefTRIAXone (ROCEPHIN)  IV 2 g (05/14/21 0820)     LOS: 1 day   Time spent: 35 minutes More than 50% of the time was  spent in counseling/coordination of care  Lorella Nimrod, MD [facility]  If 7PM-7AM, please contact night-coverage [email or web address]  05/14/2021, 2:01 PM   This record has been created using Systems analyst. Errors have been sought and corrected,but may not always be located. Such creation errors do not reflect on the standard of care.

## 2021-05-15 LAB — URINE CULTURE: Culture: >=100000 — AB

## 2021-05-15 LAB — PROCALCITONIN: Procalcitonin: 2.36 ng/mL

## 2021-05-15 MED ORDER — CEFDINIR 300 MG PO CAPS: 300.0000 mg | ORAL_CAPSULE | Freq: Two times a day (BID) | ORAL | 0 refills | Status: AC

## 2021-05-15 MED ORDER — HYDROCODONE-ACETAMINOPHEN 5-325 MG PO TABS: 1.0000 | ORAL_TABLET | ORAL | 0 refills | Status: AC | PRN

## 2021-05-15 NOTE — Progress Notes (Signed)
[facility] to be D/C'd Home per MD order.  Discussed prescriptions and follow up appointments with the patient. Prescriptions given to patient, medication list explained in detail. Pt verbalized understanding.  Allergies as of 05/15/2021       Reactions   Flomax [tamsulosin Hcl] Anaphylaxis   Toradol [ketorolac Tromethamine] Other (See Comments)   Pt states her very loopy and strange   Ketorolac    Other reaction(s): VOMITING   Pyridium  [phenazopyridine Hcl]    Other reaction(s): VOMITING        Medication List     TAKE these medications    cefdinir 300 MG capsule Commonly known as: OMNICEF Take 1 capsule (300 mg total) by mouth 2 (two) times daily for 5 days.   fluticasone 50 MCG/ACT nasal spray Commonly known as: FLONASE Place 1-2 sprays into both nostrils daily as needed for allergies or rhinitis.   HYDROcodone-acetaminophen 5-325 MG tablet Commonly known as: NORCO/VICODIN Take 1-2 tablets by mouth every 4 (four) hours as needed for moderate pain.   loratadine 10 MG tablet Commonly known as: CLARITIN Take 10 mg by mouth daily.   multivitamin with minerals tablet Take 1 tablet by mouth daily.        Vitals:   05/15/21 0436 05/15/21 0715  BP: 121/85 115/81  Pulse: 82 82  Resp: 20 18  Temp: 98.1 F (36.7 C) 97.6 F (36.4 C)  SpO2: 100% 98%    Skin clean, dry and intact without evidence of skin break down, no evidence of skin tears noted. IV catheter discontinued intact. Site without signs and symptoms of complications. Dressing and pressure applied. Pt denies pain at this time. No complaints noted.  An After Visit Summary was printed and given to the patient. Patient escorted via WC, and D/C home via private auto.  Rigoberto Noel

## 2021-05-15 NOTE — Discharge Summary (Addendum)
Physician Discharge Summary  ZAFIRA MUNOS IAX:[id number] DOB: 1981-07-05 DOA: 05/13/2021  PCP: Laneta Simmers, NP (Inactive)  Admit date: 05/13/2021 Discharge date: 05/15/2021  Admitted From: Home Disposition:  Home  Recommendations for Outpatient Follow-up:  Follow up with PCP in 1-2 weeks Please obtain BMP/CBC in one week Please follow up on the following pending results:None  Home Health:No Equipment/Devices:None Discharge Condition: Stable CODE STATUS: Full Diet recommendation:  Regular   Brief/Interim Summary: Shelly Munoz is a 40 y.o. female with history of low back pain, and  nephrolithiasis who presents with RUQ pain radiating to back, associated with nausea, vomiting, fever and chills. Had upper respiratory tract symptoms a couple weeks ago, which has for the most part resolved except for residual cough denies chest pain or shortness of breath.   Initially met sepsis criteria with being febrile at 101, mild tachycardia, leukocytosis, elevated alkaline phosphatase, elevated procalcitonin, UA with moderate leukocyte esterase. CT renal stone studies without any acute abnormality. RUQ ultrasound with biliary sludge. HIDA scan was normal and did not show any biliary or hepatic problem. Urine cultures growing E. Coli.  It was thought to be due to early pyelonephritis.  She initially received ceftriaxone and Flagyl for concern of intra-abdominal infection, Flagyl was discontinued once HIDA scan was normal.  She received ceftriaxone while in the hospital and discharged on cefdinir to complete a 7 days course.  She remained afebrile for more than 24 hours before discharge.  Patient had elevated alkaline phosphatase which started improving.  That was the reason which triggered investigation regarding biliary track and it was found to be normal.  She will complete her antibiotic treatment and follow-up with her providers for further management.  Discharge Diagnoses:  Active  Problems:   Sepsis ([place])   Right flank pain   Discharge Instructions  Discharge Instructions     Diet - low sodium heart healthy   Complete by: As directed    Discharge instructions   Complete by: As directed    It was pleasure taking care of you. You are being given antibiotics for 5 more days, please take it as directed. You are also being given some pain medications-please be mindful as they can make you dizzy/drowsy.  The most common side effect is constipation, you can use over-the-counter MiraLAX or Metamucil to help. Keep yourself well-hydrated and follow-up with your primary care provider.   Increase activity slowly   Complete by: As directed       Allergies as of 05/15/2021       Reactions   Flomax [tamsulosin Hcl] Anaphylaxis   Toradol [ketorolac Tromethamine] Other (See Comments)   Pt states her very loopy and strange   Ketorolac    Other reaction(s): VOMITING   Pyridium  [phenazopyridine Hcl]    Other reaction(s): VOMITING        Medication List     TAKE these medications    cefdinir 300 MG capsule Commonly known as: OMNICEF Take 1 capsule (300 mg total) by mouth 2 (two) times daily for 5 days.   fluticasone 50 MCG/ACT nasal spray Commonly known as: FLONASE Place 1-2 sprays into both nostrils daily as needed for allergies or rhinitis.   HYDROcodone-acetaminophen 5-325 MG tablet Commonly known as: NORCO/VICODIN Take 1-2 tablets by mouth every 4 (four) hours as needed for moderate pain.   loratadine 10 MG tablet Commonly known as: CLARITIN Take 10 mg by mouth daily.   multivitamin with minerals tablet Take 1 tablet by mouth daily.  Follow-up Information     Laneta Simmers, NP. Schedule an appointment as soon as possible for a visit in 1 week(s).   Specialty: Nurse Practitioner Contact information: [place] [address] [phone]                Allergies  Allergen Reactions   Flomax [Tamsulosin Hcl]  Anaphylaxis   Toradol [Ketorolac Tromethamine] Other (See Comments)    Pt states her very loopy and strange   Ketorolac     Other reaction(s): VOMITING   Pyridium  [Phenazopyridine Hcl]     Other reaction(s): VOMITING    Consultations: None  Procedures/Studies: DG Chest 2 View  Result Date: 05/13/2021 CLINICAL DATA:  Leukocytosis, fever, right flank pain for several days EXAM: CHEST - 2 VIEW COMPARISON:  None. FINDINGS: No consolidation, features of edema, pneumothorax, or effusion. Pulmonary vascularity is normally distributed. The cardiomediastinal contours are unremarkable. No acute osseous or soft tissue abnormality. IMPRESSION: No acute cardiopulmonary abnormality. Electronically Signed   By: Lovena Le M.D.   On: 05/13/2021 02:05   NM Hepato W/EF  Result Date: 05/14/2021 CLINICAL DATA:  Abdominal pain EXAM: NUCLEAR MEDICINE HEPATOBILIARY IMAGING WITH GALLBLADDER EF TECHNIQUE: Sequential images of the abdomen were obtained out to 60 minutes following intravenous administration of radiopharmaceutical. After oral ingestion of Ensure, gallbladder ejection fraction was determined. At 60 min, normal ejection fraction is greater than 33%. RADIOPHARMACEUTICALS:  5.49 mCi Tc-72m Choletec IV COMPARISON:  05/13/2021 FINDINGS: Prompt uptake and biliary excretion of activity by the liver is seen. Gallbladder activity is visualized, consistent with patency of cystic duct. Biliary activity passes into small bowel, consistent with patent common bile duct. Calculated gallbladder ejection fraction is 100%. (Normal gallbladder ejection fraction with Ensure is greater than 33%.) IMPRESSION: Normal uptake and excretion of biliary tracer. Normal gallbladder ejection fraction Electronically Signed   By: MInez CatalinaM.D.   On: 05/14/2021 13:33   CT Renal Stone Study  Result Date: 05/12/2021 CLINICAL DATA:  40year old female with flank pain. Concern for kidney stone. EXAM: CT ABDOMEN AND PELVIS WITHOUT  CONTRAST TECHNIQUE: Multidetector CT imaging of the abdomen and pelvis was performed following the standard protocol without IV contrast. COMPARISON:  CT abdomen pelvis dated 04/02/2017. FINDINGS: Evaluation of this exam is limited in the absence of intravenous contrast. Lower chest: The visualized lung bases are clear. No intra-abdominal free air. Trace free fluid in the pelvis. Hepatobiliary: There is fatty infiltration of the liver. The liver is mildly enlarged. Correlation with LFTs recommended to evaluate for possibility of steatohepatitis. No intrahepatic biliary dilatation. The gallbladder is unremarkable. Pancreas: Unremarkable. No pancreatic ductal dilatation or surrounding inflammatory changes. Spleen: Normal in size without focal abnormality. Adrenals/Urinary Tract: The adrenal glands unremarkable. There is no hydronephrosis or nephrolithiasis on either side. The visualized ureters and urinary bladder appear unremarkable. Stomach/Bowel: There is no bowel obstruction or active inflammation. The appendix is normal. Vascular/Lymphatic: The abdominal aorta and IVC are grossly unremarkable on this noncontrast CT. No portal venous gas. There is no adenopathy. Reproductive: Hysterectomy. No adnexal masses. Other: None Musculoskeletal: No acute or significant osseous findings. IMPRESSION: 1. No acute intra-abdominal or pelvic pathology. No hydronephrosis or nephrolithiasis. 2. Fatty liver with mild hepatomegaly. Correlation with LFTs recommended to evaluate for possibility of steatohepatitis. 3. No bowel obstruction. Normal appendix. Electronically Signed   By: AAnner CreteM.D.   On: 05/12/2021 23:36   UKoreaAbdomen Limited RUQ (LIVER/GB)  Result Date: 05/13/2021 CLINICAL DATA:  Provided history: Right  upper quadrant pain, elevated alkaline phosphatase. EXAM: ULTRASOUND ABDOMEN LIMITED RIGHT UPPER QUADRANT COMPARISON:  CT abdomen/pelvis 05/12/2021. FINDINGS: Gallbladder: Gallbladder sludge is questioned. No  gallstones or gallbladder wall thickening identified. No sonographic Percell Miller sign is elicited by the scanning technologist. Common bile duct: Diameter: 5-6 mm, within normal limits. Liver: No focal lesion identified. Within normal limits in parenchymal echogenicity. Portal vein is patent on color Doppler imaging with normal direction of blood flow towards the liver. IMPRESSION: Possible gallbladder sludge. Hepatomegaly was better appreciated on the recent prior CT abdomen/pelvis of 05/12/2021. Otherwise unremarkable right upper quadrant ultrasound, as described. Electronically Signed   By: Kellie Simmering DO   On: 05/13/2021 12:15    Subjective: Patient was seen and examined. Pain improving, no nausea or vomiting. Remain afebrile, eating and drinking ok.Husband at bed site.  Discharge Exam: Vitals:   05/15/21 0436 05/15/21 0715  BP: 121/85 115/81  Pulse: 82 82  Resp: 20 18  Temp: 98.1 F (36.7 C) 97.6 F (36.4 C)  SpO2: 100% 98%   Vitals:   05/14/21 1455 05/14/21 1937 05/15/21 0436 05/15/21 0715  BP: 106/73 103/66 121/85 115/81  Pulse: 97 91 82 82  Resp: _0 Temp: 99.3 F (37.4 C) 98.8 F (37.1 C) 98.1 F (36.7 C) 97.6 F (36.4 C)  TempSrc: Oral  Oral Oral  SpO2: 98% 100% 100% 98%  Weight:      Height:        General: Pt is alert, awake, not in acute distress Cardiovascular: RRR, S1/S2 +, no rubs, no gallops Respiratory: CTA bilaterally, no wheezing, no rhonchi Abdominal: Soft, NT, ND, bowel sounds + Extremities: no edema, no cyanosis   The results of significant diagnostics from this hospitalization (including imaging, microbiology, ancillary and laboratory) are listed below for reference.    Microbiology: Recent Results (from the past 240 hour(s))  Urine Culture     Status: Abnormal   Collection Time: 05/13/21  1:17 AM   Specimen: Urine, Random  Result Value Ref Range Status   Specimen Description   Final    URINE, RANDOM Performed at [facility],  [address] [place], [place] [address]    Special Requests   Final    NONE Performed at [facility], [place], [place] [address]    Culture >=100,000 COLONIES/mL ESCHERICHIA COLI (A)  Final   Report Status 05/15/2021 FINAL  Final   Organism ID, Bacteria ESCHERICHIA COLI (A)  Final      Susceptibility   Escherichia coli - MIC*    AMPICILLIN <=2 SENSITIVE Sensitive     CEFAZOLIN <=4 SENSITIVE Sensitive     CEFEPIME <=0.12 SENSITIVE Sensitive     CEFTRIAXONE <=0.25 SENSITIVE Sensitive     CIPROFLOXACIN <=0.25 SENSITIVE Sensitive     GENTAMICIN <=1 SENSITIVE Sensitive     IMIPENEM <=0.25 SENSITIVE Sensitive     NITROFURANTOIN 64 INTERMEDIATE Intermediate     TRIMETH/SULFA <=20 SENSITIVE Sensitive     AMPICILLIN/SULBACTAM <=2 SENSITIVE Sensitive     PIP/TAZO <=4 SENSITIVE Sensitive     * >=100,000 COLONIES/mL ESCHERICHIA COLI  Blood culture (routine x 2)     Status: None (Preliminary result)   Collection Time: 05/13/21  2:28 AM   Specimen: BLOOD  Result Value Ref Range Status   Specimen Description BLOOD RIGHT ASSIST CONTROL  Final   Special Requests   Final    BOTTLES DRAWN AEROBIC AND ANAEROBIC Blood Culture adequate volume   Culture  Final    NO GROWTH 3 DAYS Performed at [facility], [address] [place], [place] [address]    Report Status PENDING  Incomplete  Blood culture (routine x 2)     Status: None (Preliminary result)   Collection Time: 05/13/21  2:28 AM   Specimen: BLOOD  Result Value Ref Range Status   Specimen Description BLOOD LEFT HAND  Final   Special Requests   Final    BOTTLES DRAWN AEROBIC AND ANAEROBIC Blood Culture adequate volume   Culture  Setup Time   Final    Organism ID to follow GRAM NEGATIVE RODS AEROBIC BOTTLE ONLY CRITICAL RESULT CALLED TO, READ BACK BY AND VERIFIED WITH: KELLY EISENHOWER ON 05/16/21 AT 0849 QSD Performed at [facility], [address] [place], [place] [address]     Culture GRAM NEGATIVE RODS  Final   Report Status PENDING  Incomplete  Resp Panel by RT-PCR (Flu A&B, Covid) Nasopharyngeal Swab     Status: None   Collection Time: 05/13/21  2:28 AM   Specimen: Nasopharyngeal Swab; Nasopharyngeal(NP) swabs in vial transport medium  Result Value Ref Range Status   SARS Coronavirus 2 by RT PCR NEGATIVE NEGATIVE Final    Comment: (NOTE) SARS-CoV-2 target nucleic acids are NOT DETECTED.  The SARS-CoV-2 RNA is generally detectable in upper respiratory specimens during the acute phase of infection. The lowest concentration of SARS-CoV-2 viral copies this assay can detect is 138 copies/mL. A negative result does not preclude SARS-Cov-2 infection and should not be used as the sole basis for treatment or other patient management decisions. A negative result may occur with  improper specimen collection/handling, submission of specimen other than nasopharyngeal swab, presence of viral mutation(s) within the areas targeted by this assay, and inadequate number of viral copies(<138 copies/mL). A negative result must be combined with clinical observations, patient history, and epidemiological information. The expected result is Negative.  Fact Sheet for Patients:  EntrepreneurPulse.com.au  Fact Sheet for Healthcare Providers:  IncredibleEmployment.be  This test is no t yet approved or cleared by the [place] FDA and  has been authorized for detection and/or diagnosis of SARS-CoV-2 by FDA under an Emergency Use Authorization (EUA). This EUA will remain  in effect (meaning this test can be used) for the duration of the COVID-19 declaration under Section 564(b)(1) of the Act, 21 U.S.C.section 360bbb-3(b)(1), unless the authorization is terminated  or revoked sooner.       Influenza A by PCR NEGATIVE NEGATIVE Final   Influenza B by PCR NEGATIVE NEGATIVE Final    Comment: (NOTE) The Xpert Xpress SARS-CoV-2/FLU/RSV plus  assay is intended as an aid in the diagnosis of influenza from Nasopharyngeal swab specimens and should not be used as a sole basis for treatment. Nasal washings and aspirates are unacceptable for Xpert Xpress SARS-CoV-2/FLU/RSV testing.  Fact Sheet for Patients: EntrepreneurPulse.com.au  Fact Sheet for Healthcare Providers: IncredibleEmployment.be  This test is not yet approved or cleared by the [place] FDA and has been authorized for detection and/or diagnosis of SARS-CoV-2 by FDA under an Emergency Use Authorization (EUA). This EUA will remain in effect (meaning this test can be used) for the duration of the COVID-19 declaration under Section 564(b)(1) of the Act, 21 U.S.C. section 360bbb-3(b)(1), unless the authorization is terminated or revoked.  Performed at [facility], [address] [place], [place] [address]   Blood Culture ID Panel (Reflexed)     Status: Abnormal   Collection Time: 05/13/21  2:28  AM  Result Value Ref Range Status   Enterococcus faecalis NOT DETECTED NOT DETECTED Final   Enterococcus Faecium NOT DETECTED NOT DETECTED Final   Listeria monocytogenes NOT DETECTED NOT DETECTED Final   Staphylococcus species NOT DETECTED NOT DETECTED Final   Staphylococcus aureus (BCID) NOT DETECTED NOT DETECTED Final   Staphylococcus epidermidis NOT DETECTED NOT DETECTED Final   Staphylococcus lugdunensis NOT DETECTED NOT DETECTED Final   Streptococcus species NOT DETECTED NOT DETECTED Final   Streptococcus agalactiae NOT DETECTED NOT DETECTED Final   Streptococcus pneumoniae NOT DETECTED NOT DETECTED Final   Streptococcus pyogenes NOT DETECTED NOT DETECTED Final   A.calcoaceticus-baumannii NOT DETECTED NOT DETECTED Final   Bacteroides fragilis NOT DETECTED NOT DETECTED Final   Enterobacterales DETECTED (A) NOT DETECTED Final    Comment: Enterobacterales represent a large order of gram negative bacteria, not a single  organism. CRITICAL RESULT CALLED TO, READ BACK BY AND VERIFIED WITH: KELLY EISENHOWER ON 05/16/21 AT 0849 QSD    Enterobacter cloacae complex NOT DETECTED NOT DETECTED Final   Escherichia coli DETECTED (A) NOT DETECTED Final    Comment: CRITICAL RESULT CALLED TO, READ BACK BY AND VERIFIED WITH: KELLY EISENHOWER ON 05/16/21 AT 0849 QSD    Klebsiella aerogenes NOT DETECTED NOT DETECTED Final   Klebsiella oxytoca NOT DETECTED NOT DETECTED Final   Klebsiella pneumoniae NOT DETECTED NOT DETECTED Final   Proteus species NOT DETECTED NOT DETECTED Final   Salmonella species NOT DETECTED NOT DETECTED Final   Serratia marcescens NOT DETECTED NOT DETECTED Final   Haemophilus influenzae NOT DETECTED NOT DETECTED Final   Neisseria meningitidis NOT DETECTED NOT DETECTED Final   Pseudomonas aeruginosa NOT DETECTED NOT DETECTED Final   Stenotrophomonas maltophilia NOT DETECTED NOT DETECTED Final   Candida albicans NOT DETECTED NOT DETECTED Final   Candida auris NOT DETECTED NOT DETECTED Final   Candida glabrata NOT DETECTED NOT DETECTED Final   Candida krusei NOT DETECTED NOT DETECTED Final   Candida parapsilosis NOT DETECTED NOT DETECTED Final   Candida tropicalis NOT DETECTED NOT DETECTED Final   Cryptococcus neoformans/gattii NOT DETECTED NOT DETECTED Final   CTX-M ESBL NOT DETECTED NOT DETECTED Final   Carbapenem resistance IMP NOT DETECTED NOT DETECTED Final   Carbapenem resistance KPC NOT DETECTED NOT DETECTED Final   Carbapenem resistance NDM NOT DETECTED NOT DETECTED Final   Carbapenem resist OXA 48 LIKE NOT DETECTED NOT DETECTED Final   Carbapenem resistance VIM NOT DETECTED NOT DETECTED Final    Comment: Performed at [facility], [address] [place], [place] [address]     Labs: BNP (last 3 results) No results for input(s): BNP in the last 8760 hours. Basic Metabolic Panel: Recent Labs  Lab 05/12/21 2130 05/13/21 0452 05/14/21 0458  NA 133* 134* 135  K 4.3 4.5 3.5   CL 98 102 103  CO2 _0 GLUCOSE 169* 112* 82  BUN _1 CREATININE 1.20* 1.18* 1.17*  CALCIUM 9.0 8.8* 7.6*   Liver Function Tests: Recent Labs  Lab 05/12/21 2130 05/13/21 0452 05/14/21 0458  AST _2 ALT _3 ALKPHOS 430* 365* 278*  BILITOT 0.7 0.9 0.9  PROT 6.8 5.9* 5.7*  ALBUMIN 2.7* 2.3* 2.1*   Recent Labs  Lab 05/12/21 2130  LIPASE 19   No results for input(s): AMMONIA in the last 168 hours. CBC: Recent Labs  Lab 05/12/21 2130 05/13/21 0452 05/14/21 0458  WBC 23.1* 28.6* 21.9*  NEUTROABS 20.7*  --   --  HGB 11.1* 9.4* 8.3*  HCT 34.0* 28.4* 25.1*  MCV 86.5 87.7 86.9  PLT 425* 344 320   Cardiac Enzymes: No results for input(s): CKTOTAL, CKMB, CKMBINDEX, TROPONINI in the last 168 hours. BNP: Invalid input(s): POCBNP CBG: No results for input(s): GLUCAP in the last 168 hours. D-Dimer No results for input(s): DDIMER in the last 72 hours. Hgb A1c No results for input(s): HGBA1C in the last 72 hours. Lipid Profile No results for input(s): CHOL, HDL, LDLCALC, TRIG, CHOLHDL, LDLDIRECT in the last 72 hours. Thyroid function studies No results for input(s): TSH, T4TOTAL, T3FREE, THYROIDAB in the last 72 hours.  Invalid input(s): FREET3 Anemia work up No results for input(s): VITAMINB12, FOLATE, FERRITIN, TIBC, IRON, RETICCTPCT in the last 72 hours. Urinalysis    Component Value Date/Time   COLORURINE YELLOW (A) 05/13/2021 0117   APPEARANCEUR HAZY (A) 05/13/2021 0117   APPEARANCEUR Clear 05/01/2012 0833   LABSPEC 1.013 05/13/2021 0117   LABSPEC 1.003 05/01/2012 0833   PHURINE 5.0 05/13/2021 0117   GLUCOSEU NEGATIVE 05/13/2021 0117   GLUCOSEU Negative 05/01/2012 0833   HGBUR MODERATE (A) 05/13/2021 0117   BILIRUBINUR NEGATIVE 05/13/2021 0117   BILIRUBINUR Negative 05/01/2012 0833   KETONESUR NEGATIVE 05/13/2021 0117   PROTEINUR 30 (A) 05/13/2021 0117   NITRITE NEGATIVE 05/13/2021 0117   LEUKOCYTESUR MODERATE (A) 05/13/2021 0117    LEUKOCYTESUR Negative 05/01/2012 0833   Sepsis Labs Invalid input(s): PROCALCITONIN,  WBC,  LACTICIDVEN Microbiology Recent Results (from the past 240 hour(s))  Urine Culture     Status: Abnormal   Collection Time: 05/13/21  1:17 AM   Specimen: Urine, Random  Result Value Ref Range Status   Specimen Description   Final    URINE, RANDOM Performed at [facility], [address] [place], [place] [address]    Special Requests   Final    NONE Performed at [facility], [address] [place], [place] [address]    Culture >=100,000 COLONIES/mL ESCHERICHIA COLI (A)  Final   Report Status 05/15/2021 FINAL  Final   Organism ID, Bacteria ESCHERICHIA COLI (A)  Final      Susceptibility   Escherichia coli - MIC*    AMPICILLIN <=2 SENSITIVE Sensitive     CEFAZOLIN <=4 SENSITIVE Sensitive     CEFEPIME <=0.12 SENSITIVE Sensitive     CEFTRIAXONE <=0.25 SENSITIVE Sensitive     CIPROFLOXACIN <=0.25 SENSITIVE Sensitive     GENTAMICIN <=1 SENSITIVE Sensitive     IMIPENEM <=0.25 SENSITIVE Sensitive     NITROFURANTOIN 64 INTERMEDIATE Intermediate     TRIMETH/SULFA <=20 SENSITIVE Sensitive     AMPICILLIN/SULBACTAM <=2 SENSITIVE Sensitive     PIP/TAZO <=4 SENSITIVE Sensitive     * >=100,000 COLONIES/mL ESCHERICHIA COLI  Blood culture (routine x 2)     Status: None (Preliminary result)   Collection Time: 05/13/21  2:28 AM   Specimen: BLOOD  Result Value Ref Range Status   Specimen Description BLOOD RIGHT ASSIST CONTROL  Final   Special Requests   Final    BOTTLES DRAWN AEROBIC AND ANAEROBIC Blood Culture adequate volume   Culture   Final    NO GROWTH 3 DAYS Performed at [facility], [address] [place], [place] [address]    Report Status PENDING  Incomplete  Blood culture (routine x 2)     Status: None (Preliminary result)   Collection Time: 05/13/21  2:28 AM   Specimen: BLOOD  Result Value Ref Range Status   Specimen Description  BLOOD LEFT HAND   Final   Special Requests   Final    BOTTLES DRAWN AEROBIC AND ANAEROBIC Blood Culture adequate volume   Culture  Setup Time   Final    Organism ID to follow GRAM NEGATIVE RODS AEROBIC BOTTLE ONLY CRITICAL RESULT CALLED TO, READ BACK BY AND VERIFIED WITH: KELLY EISENHOWER ON 05/16/21 AT 0849 QSD Performed at [facility], [address], [place], [place] [address]    Culture GRAM NEGATIVE RODS  Final   Report Status PENDING  Incomplete  Resp Panel by RT-PCR (Flu A&B, Covid) Nasopharyngeal Swab     Status: None   Collection Time: 05/13/21  2:28 AM   Specimen: Nasopharyngeal Swab; Nasopharyngeal(NP) swabs in vial transport medium  Result Value Ref Range Status   SARS Coronavirus 2 by RT PCR NEGATIVE NEGATIVE Final    Comment: (NOTE) SARS-CoV-2 target nucleic acids are NOT DETECTED.  The SARS-CoV-2 RNA is generally detectable in upper respiratory specimens during the acute phase of infection. The lowest concentration of SARS-CoV-2 viral copies this assay can detect is 138 copies/mL. A negative result does not preclude SARS-Cov-2 infection and should not be used as the sole basis for treatment or other patient management decisions. A negative result may occur with  improper specimen collection/handling, submission of specimen other than nasopharyngeal swab, presence of viral mutation(s) within the areas targeted by this assay, and inadequate number of viral copies(<138 copies/mL). A negative result must be combined with clinical observations, patient history, and epidemiological information. The expected result is Negative.  Fact Sheet for Patients:  EntrepreneurPulse.com.au  Fact Sheet for Healthcare Providers:  IncredibleEmployment.be  This test is no t yet approved or cleared by the [place] FDA and  has been authorized for detection and/or diagnosis of SARS-CoV-2 by FDA under an Emergency Use Authorization (EUA). This EUA will  remain  in effect (meaning this test can be used) for the duration of the COVID-19 declaration under Section 564(b)(1) of the Act, 21 U.S.C.section 360bbb-3(b)(1), unless the authorization is terminated  or revoked sooner.       Influenza A by PCR NEGATIVE NEGATIVE Final   Influenza B by PCR NEGATIVE NEGATIVE Final    Comment: (NOTE) The Xpert Xpress SARS-CoV-2/FLU/RSV plus assay is intended as an aid in the diagnosis of influenza from Nasopharyngeal swab specimens and should not be used as a sole basis for treatment. Nasal washings and aspirates are unacceptable for Xpert Xpress SARS-CoV-2/FLU/RSV testing.  Fact Sheet for Patients: EntrepreneurPulse.com.au  Fact Sheet for Healthcare Providers: IncredibleEmployment.be  This test is not yet approved or cleared by the [place] FDA and has been authorized for detection and/or diagnosis of SARS-CoV-2 by FDA under an Emergency Use Authorization (EUA). This EUA will remain in effect (meaning this test can be used) for the duration of the COVID-19 declaration under Section 564(b)(1) of the Act, 21 U.S.C. section 360bbb-3(b)(1), unless the authorization is terminated or revoked.  Performed at [facility], [address] [place], [place] [address]   Blood Culture ID Panel (Reflexed)     Status: Abnormal   Collection Time: 05/13/21  2:28 AM  Result Value Ref Range Status   Enterococcus faecalis NOT DETECTED NOT DETECTED Final   Enterococcus Faecium NOT DETECTED NOT DETECTED Final   Listeria monocytogenes NOT DETECTED NOT DETECTED Final   Staphylococcus species NOT DETECTED NOT DETECTED Final   Staphylococcus aureus (BCID) NOT DETECTED NOT DETECTED Final   Staphylococcus epidermidis NOT DETECTED NOT DETECTED Final  Staphylococcus lugdunensis NOT DETECTED NOT DETECTED Final   Streptococcus species NOT DETECTED NOT DETECTED Final   Streptococcus agalactiae NOT DETECTED NOT DETECTED  Final   Streptococcus pneumoniae NOT DETECTED NOT DETECTED Final   Streptococcus pyogenes NOT DETECTED NOT DETECTED Final   A.calcoaceticus-baumannii NOT DETECTED NOT DETECTED Final   Bacteroides fragilis NOT DETECTED NOT DETECTED Final   Enterobacterales DETECTED (A) NOT DETECTED Final    Comment: Enterobacterales represent a large order of gram negative bacteria, not a single organism. CRITICAL RESULT CALLED TO, READ BACK BY AND VERIFIED WITH: KELLY EISENHOWER ON 05/16/21 AT 0849 QSD    Enterobacter cloacae complex NOT DETECTED NOT DETECTED Final   Escherichia coli DETECTED (A) NOT DETECTED Final    Comment: CRITICAL RESULT CALLED TO, READ BACK BY AND VERIFIED WITH: KELLY EISENHOWER ON 05/16/21 AT 0849 QSD    Klebsiella aerogenes NOT DETECTED NOT DETECTED Final   Klebsiella oxytoca NOT DETECTED NOT DETECTED Final   Klebsiella pneumoniae NOT DETECTED NOT DETECTED Final   Proteus species NOT DETECTED NOT DETECTED Final   Salmonella species NOT DETECTED NOT DETECTED Final   Serratia marcescens NOT DETECTED NOT DETECTED Final   Haemophilus influenzae NOT DETECTED NOT DETECTED Final   Neisseria meningitidis NOT DETECTED NOT DETECTED Final   Pseudomonas aeruginosa NOT DETECTED NOT DETECTED Final   Stenotrophomonas maltophilia NOT DETECTED NOT DETECTED Final   Candida albicans NOT DETECTED NOT DETECTED Final   Candida auris NOT DETECTED NOT DETECTED Final   Candida glabrata NOT DETECTED NOT DETECTED Final   Candida krusei NOT DETECTED NOT DETECTED Final   Candida parapsilosis NOT DETECTED NOT DETECTED Final   Candida tropicalis NOT DETECTED NOT DETECTED Final   Cryptococcus neoformans/gattii NOT DETECTED NOT DETECTED Final   CTX-M ESBL NOT DETECTED NOT DETECTED Final   Carbapenem resistance IMP NOT DETECTED NOT DETECTED Final   Carbapenem resistance KPC NOT DETECTED NOT DETECTED Final   Carbapenem resistance NDM NOT DETECTED NOT DETECTED Final   Carbapenem resist OXA 48 LIKE NOT DETECTED  NOT DETECTED Final   Carbapenem resistance VIM NOT DETECTED NOT DETECTED Final    Comment: Performed at [facility], [address] [place], [place] [address]    Time coordinating discharge: Over 30 minutes  SIGNED:  Lorella Nimrod, MD  [facility] 05/16/2021, 4:57 PM  If 7PM-7AM, please contact night-coverage [email or web address]  This record has been created using Systems analyst. Errors have been sought and corrected,but may not always be located. Such creation errors do not reflect on the standard of care.

## 2021-05-16 LAB — BLOOD CULTURE ID PANEL (REFLEXED) - BCID2

## 2021-05-18 LAB — CULTURE, BLOOD (ROUTINE X 2)
Culture: NO GROWTH
Special Requests: ADEQUATE
Special Requests: ADEQUATE

## 2022-04-16 IMAGING — NM NM HEPATO W/GB/PHARM/[PERSON_NAME]
3 series · 13 of 13 positions shown · non-contrast
Comparison: 05/13/2021

CLINICAL DATA: Abdominal pain

EXAM:
NUCLEAR MEDICINE HEPATOBILIARY IMAGING WITH GALLBLADDER EF
TECHNIQUE: Sequential images of the abdomen were obtained [DATE] minutes
following intravenous administration of radiopharmaceutical. After
oral ingestion of Ensure, gallbladder ejection fraction was
determined. At 60 min, normal ejection fraction is greater than 33%.
RADIOPHARMACEUTICALS:  5.49 mCi Jc-RRm  Choletec IV

[Series 1000: hepatobiliary scan · 9.59mm/px · 6 of 60 frames shown]
[frame 6/60]
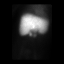
[frame 16/60]
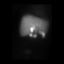
[frame 26/60]
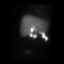
[frame 36/60]
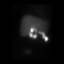
[frame 46/60]
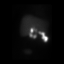
[frame 56/60]
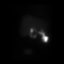

[Series 1000: gallbladder ef · 4.80mm/px · 6 of 120 frames shown]
[frame 11/120]
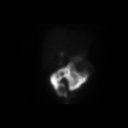
[frame 31/120]
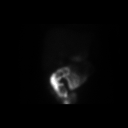
[frame 51/120]
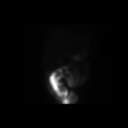
[frame 71/120]
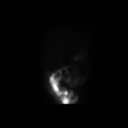
[frame 91/120]
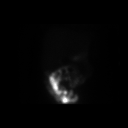
[frame 111/120]
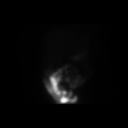

[Series 1000: gallbladder delays · 3.30mm/px · 1 of 1 slices shown]
[im 1/1]
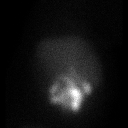

[13 of 13 positions shown; findings below may reference images not displayed]

FINDINGS: Prompt uptake and biliary excretion of activity by the liver is
seen. Gallbladder activity is visualized, consistent with patency of
cystic duct. Biliary activity passes into small bowel, consistent
with patent common bile duct.

Calculated gallbladder ejection fraction is 100%. (Normal
gallbladder ejection fraction with Ensure is greater than 33%.)
IMPRESSION: Normal uptake and excretion of biliary tracer.

Normal gallbladder ejection fraction

## 2022-04-16 IMAGING — US US ABDOMEN LIMITED
1 series · 14 of 25 positions shown · non-contrast
Comparison: CT abdomen/pelvis 05/12/2021.

CLINICAL DATA: Provided history: Right upper quadrant pain,
elevated alkaline phosphatase.

EXAM:
ULTRASOUND ABDOMEN LIMITED RIGHT UPPER QUADRANT

[Series 1: us abdomen limited ruq (liver/gb) · 14 of 92 slices shown]
[im 1/92]
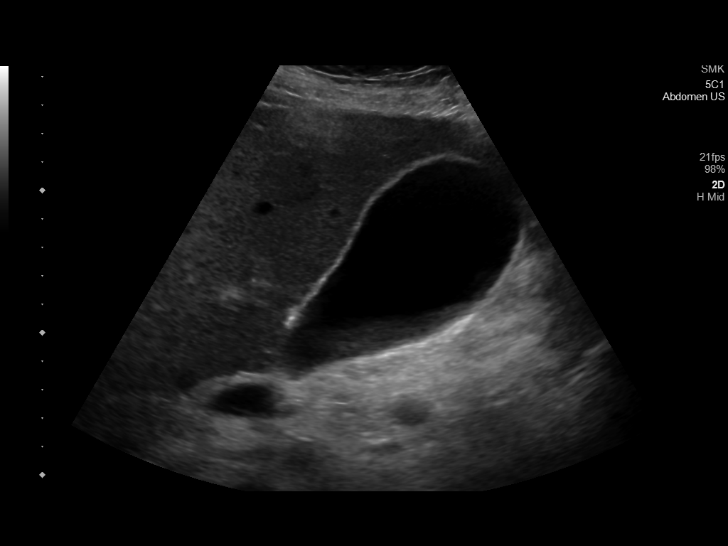
[im 8/92]
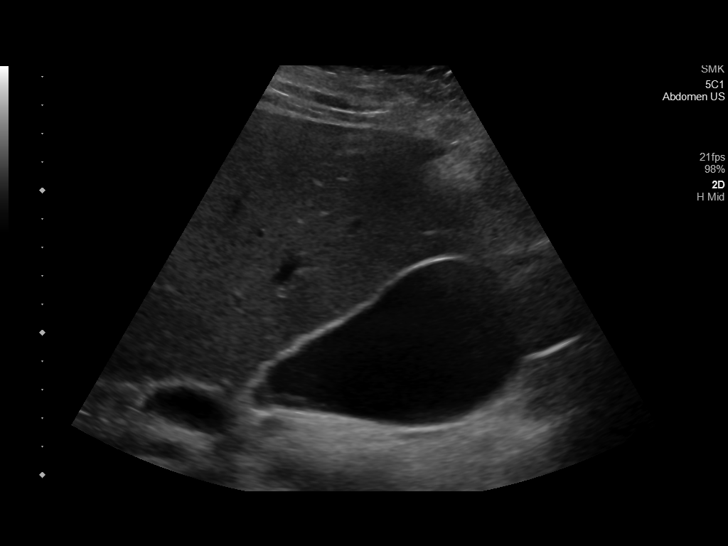
[im 16/92]
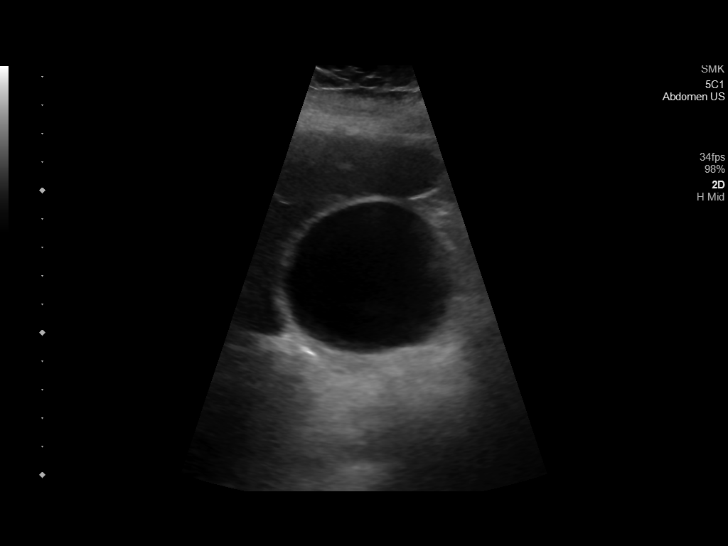
[im 23/92]
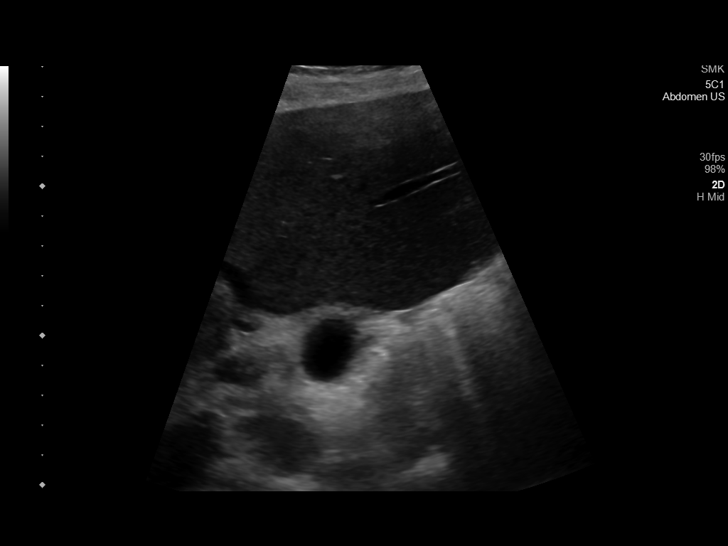
[im 31/92]
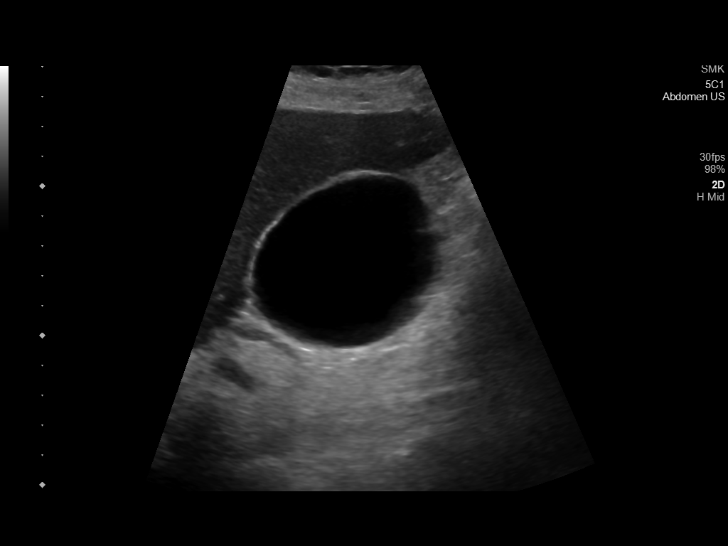
[im 35/92]
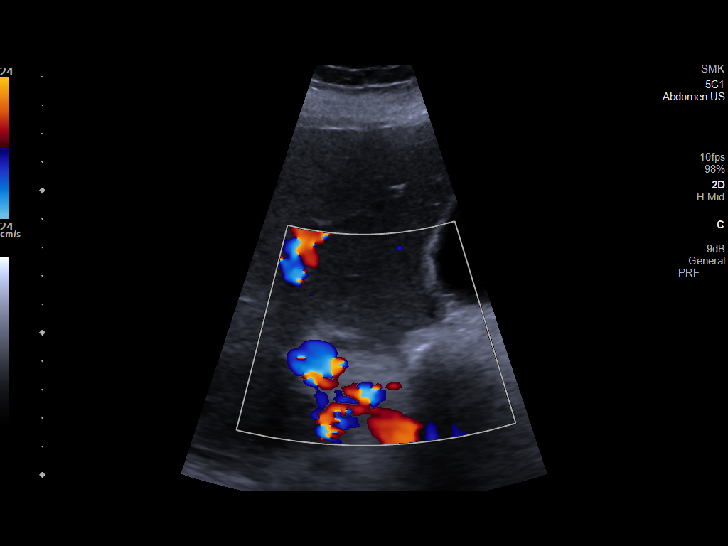
[im 42/92]
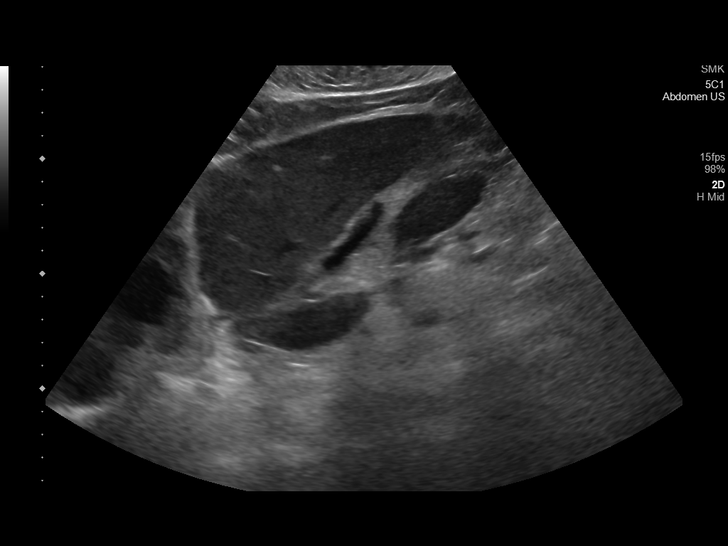
[im 50/92]
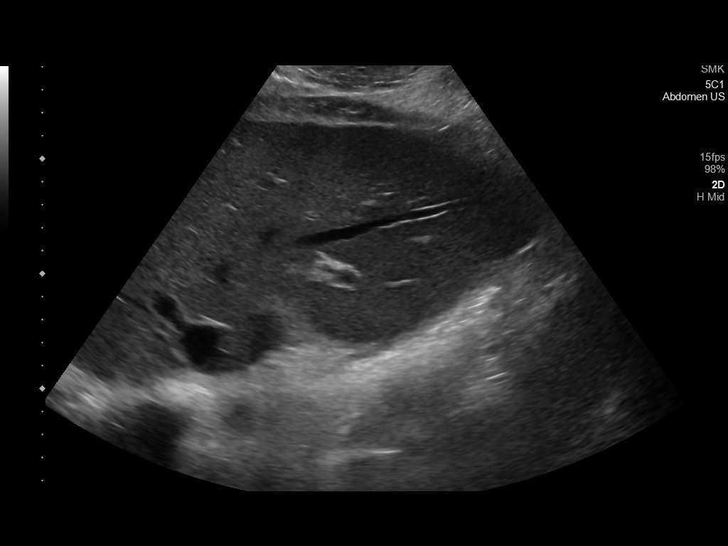
[im 57/92]
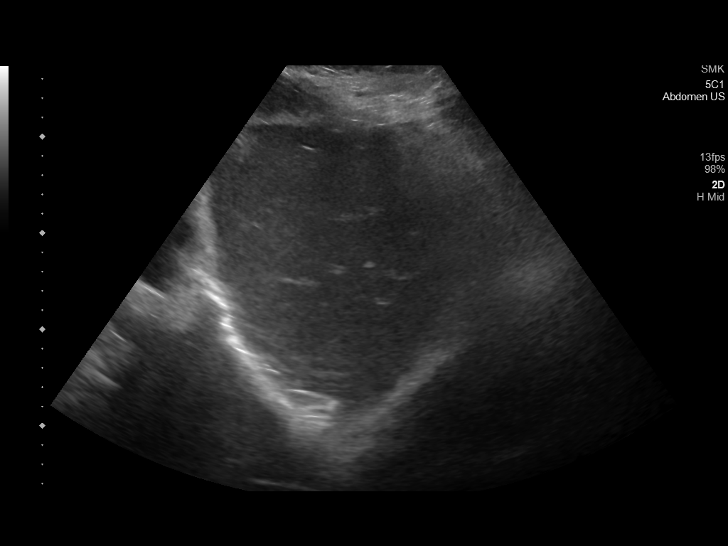
[im 61/92]
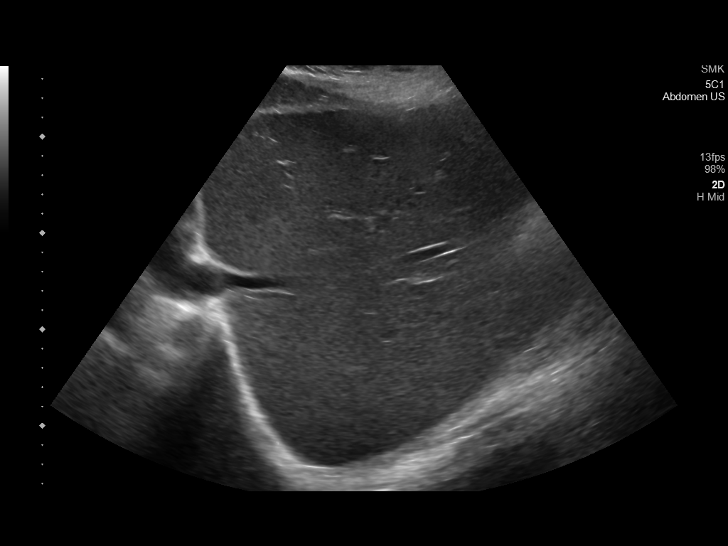
[im 69/92]
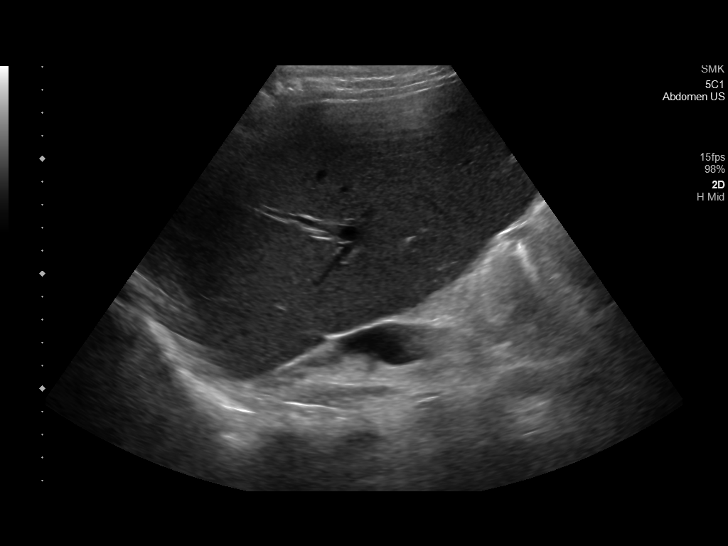
[im 76/92]
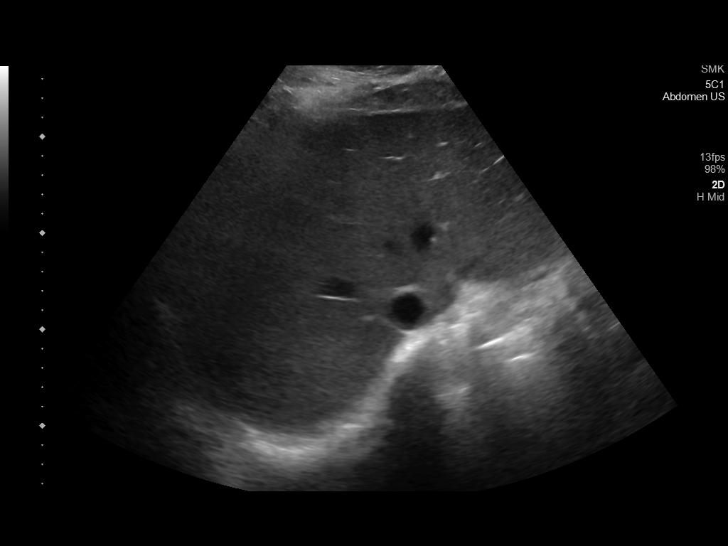
[im 84/92]
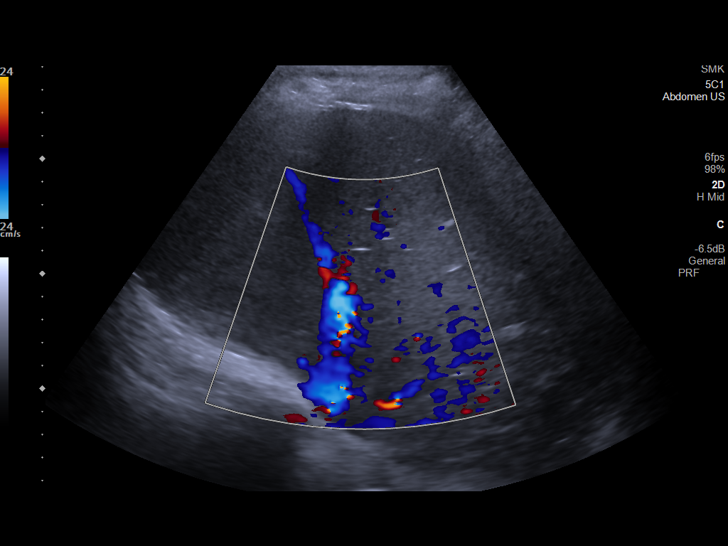
[im 92/92]
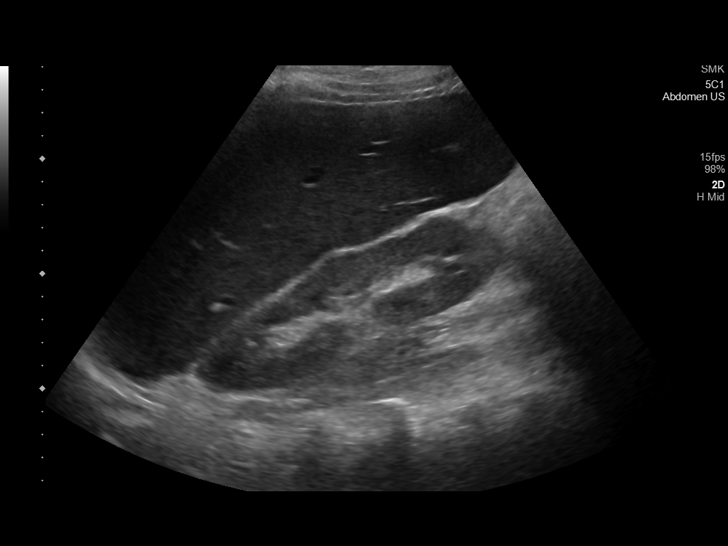

[14 of 25 positions shown; findings below may reference images not displayed]

FINDINGS: Gallbladder:

Gallbladder sludge is questioned. No gallstones or gallbladder wall
thickening identified. No sonographic Murphy sign is elicited by the
scanning technologist.

Common bile duct:

Diameter: 5-6 mm, within normal limits.

Liver:

No focal lesion identified. Within normal limits in parenchymal
echogenicity. Portal vein is patent on color Doppler imaging with
normal direction of blood flow towards the liver.
IMPRESSION: Possible gallbladder sludge.

Hepatomegaly was better appreciated on the recent prior CT
abdomen/pelvis of 05/12/2021.

Otherwise unremarkable right upper quadrant ultrasound, as
described.

## 2023-08-22 ENCOUNTER — Encounter: Payer: Self-pay | Admitting: Emergency Medicine

## 2023-08-22 ENCOUNTER — Ambulatory Visit
Admission: EM | Admit: 2023-08-22 | Discharge: 2023-08-22 | Disposition: A | Discharge status: Home / Self Care | Payer: BC Managed Care – PPO | Attending: Emergency Medicine | Admitting: Emergency Medicine

## 2023-08-22 DIAGNOSIS — U071 COVID-19: Secondary | ICD-10-CM

## 2023-08-22 MED ORDER — BENZONATATE 100 MG PO CAPS: 100.0000 mg | ORAL_CAPSULE | Freq: Three times a day (TID) | ORAL | 0 refills | Status: AC

## 2023-08-22 MED ORDER — PREDNISONE 20 MG PO TABS: 40.0000 mg | ORAL_TABLET | Freq: Every day | ORAL | 0 refills | Status: AC

## 2023-08-22 MED ORDER — PROMETHAZINE-DM 6.25-15 MG/5ML PO SYRP: 5.0000 mL | ORAL_SOLUTION | Freq: Every evening | ORAL | 0 refills | Status: AC | PRN

## 2023-08-22 NOTE — ED Triage Notes (Signed)
Pt c/o cough and congestion x 3 days that became worse yesterday. Pts at home covid test was positive yesterday.

## 2023-08-22 NOTE — ED Provider Notes (Signed)
Renaldo Fiddler    CSN: [id number] Arrival date & time: 08/22/23  1003      History   Chief Complaint Chief Complaint  Patient presents with   Cough    HPI Shelly Munoz is a 42 y.o. female.   Patient presents for evaluation of objective fever, body aches ,nasal congestion, rhinorrhea, head congestion, nonproductive cough beginning 3 days ago.  Symptoms greatly worsening 1 day ago, interfering with sleep.  Experiencing centralized chest pain and tightness described as a soreness, exacerbated by coughing.  Pain can also be felt to the center of the back intermittently.  Experiencing shortness of breath only with episodes of coughing.  Denies presence of wheezing.  History of childhood asthma, hypertension.  No known sick contact prior.  Past Medical History:  Diagnosis Date   Chronic pain    Hypertension    Nephrolithiasis    Vitamin D deficiency     Patient Active Problem List   Diagnosis Date Noted   Sepsis (HCC) 05/13/2021   Right flank pain 05/13/2021   RUQ pain     Past Surgical History:  Procedure Laterality Date   ABDOMINAL HYSTERECTOMY     CESAREAN SECTION     TUBAL LIGATION      OB History   No obstetric history on file.      Home Medications    Prior to Admission medications   Medication Sig Start Date End Date Taking? Authorizing Provider  benzonatate (TESSALON) 100 MG capsule Take 1 capsule (100 mg total) by mouth every 8 (eight) hours. 08/22/23  Yes Lynda Capistran R, NP  predniSONE (DELTASONE) 20 MG tablet Take 2 tablets (40 mg total) by mouth daily. 08/22/23  Yes Jester Klingberg R, NP  promethazine-dextromethorphan (PROMETHAZINE-DM) 6.25-15 MG/5ML syrup Take 5 mLs by mouth at bedtime as needed for cough. 08/22/23  Yes Teal Bontrager R, NP  fluticasone (FLONASE) 50 MCG/ACT nasal spray Place 1-2 sprays into both nostrils daily as needed for allergies or rhinitis.    [provider]  HYDROcodone-acetaminophen (NORCO/VICODIN) 5-325 MG  tablet Take 1-2 tablets by mouth every 4 (four) hours as needed for moderate pain. 05/15/21   Arnetha Courser, MD  loratadine (CLARITIN) 10 MG tablet Take 10 mg by mouth daily.    [provider]  Multiple Vitamins-Minerals (MULTIVITAMIN WITH MINERALS) tablet Take 1 tablet by mouth daily.    [provider]    Family History History reviewed. No pertinent family history.  Social History Social History   Tobacco Use   Smoking status: Former    Current packs/day: 0.50    Average packs/day: 0.5 packs/day for 5.0 years (2.5 ttl pk-yrs)    Types: Cigarettes   Smokeless tobacco: Never  Vaping Use   Vaping status: Every Day  Substance Use Topics   Alcohol use: No   Drug use: No     Allergies   Flomax [tamsulosin hcl], Toradol [ketorolac tromethamine], Ketorolac, and Pyridium  [phenazopyridine hcl]   Review of Systems Review of Systems   Physical Exam Triage Vital Signs ED Triage Vitals  Encounter Vitals Group     BP 08/22/23 1053 121/83     Systolic BP Percentile --      Diastolic BP Percentile --      Pulse Rate 08/22/23 1053 79     Resp 08/22/23 1053 17     Temp 08/22/23 1053 98 F (36.7 C)     Temp Source 08/22/23 1053 Oral     SpO2 08/22/23 1053  98 %     Weight --      Height --      Head Circumference --      Peak Flow --      Pain Score 08/22/23 1049 4     Pain Loc --      Pain Education --      Exclude from Growth Chart --    No data found.  Updated Vital Signs BP 121/83 (BP Location: Left Arm)   Pulse 79   Temp 98 F (36.7 C) (Oral)   Resp 17   SpO2 98%   Visual Acuity Right Eye Distance:   Left Eye Distance:   Bilateral Distance:    Right Eye Near:   Left Eye Near:    Bilateral Near:     Physical Exam Constitutional:      Appearance: Normal appearance.  HENT:     Right Ear: Tympanic membrane, ear canal and external ear normal.     Left Ear: Tympanic membrane, ear canal and external ear normal.     Nose: Congestion and  rhinorrhea present.     Mouth/Throat:     Mouth: Mucous membranes are moist.     Pharynx: No posterior oropharyngeal erythema.  Eyes:     Extraocular Movements: Extraocular movements intact.  Cardiovascular:     Rate and Rhythm: Normal rate and regular rhythm.     Pulses: Normal pulses.     Heart sounds: Normal heart sounds.  Pulmonary:     Effort: Pulmonary effort is normal.     Breath sounds: Normal breath sounds.  Chest:     Comments: Tenderness present to the center of the chest wall, no ecchymosis swelling or deformity, chest wall is symmetrical Musculoskeletal:     Cervical back: Normal range of motion and neck supple.  Skin:    General: Skin is warm and dry.  Neurological:     Mental Status: She is alert and oriented to person, place, and time. Mental status is at baseline.      UC Treatments / Results  Labs (all labs ordered are listed, but only abnormal results are displayed) Labs Reviewed - No data to display  EKG   Radiology No results found.  Procedures Procedures (including critical care time)  Medications Ordered in UC Medications - No data to display  Initial Impression / Assessment and Plan / UC Course  I have reviewed the triage vital signs and the nursing notes.  Pertinent labs & imaging results that were available during my care of the patient were reviewed by me and considered in my medical decision making (see chart for details).  COVID-19  Patient is in no signs of distress nor toxic appearing.  Vital signs are stable.  Low suspicion for pneumonia, pneumothorax or bronchitis and therefore will defer imaging.  Home COVID testing negative, will not repeat PCR.  Generally healthy adult with minimal comorbidity therefore antiviral use deferred.  Discussed CDC quarantine guidelines and given work note.  Prescribed prednisone, Tessalon and Promethazine DM.May use additional over-the-counter medications as needed for supportive care.  May follow-up with  urgent care as needed if symptoms persist or worsen.   Final Clinical Impressions(s) / UC Diagnoses   Final diagnoses:  COVID-19     Discharge Instructions      COVID-19 is a virus and should steadily improve in time, most viruses can take up to 7 to 10 days before you truly start to see a turnaround however things will get  better  Per the CDC, you will need to quarantine until without fever for 24 hours, if experiencing no fever you may continue activity wearing mask until all symptoms have resolved  Begin prednisone every morning with food for 5 days to reduce inflammation to the airway and to the muscles, will help with chest pain and tightness  May use Tessalon pill every 8 hours to help calm your coughing  May use cough syrup at bedtime for additional comfort, be mindful this will make you feel sleepy    You can take Tylenol and/or Ibuprofen as needed for fever reduction and pain relief.   For cough: honey 1/2 to 1 teaspoon (you can dilute the honey in water or another fluid).  You can also use guaifenesin and dextromethorphan for cough. You can use a humidifier for chest congestion and cough.  If you don't have a humidifier, you can sit in the bathroom with the hot shower running.      For sore throat: try warm salt water gargles, cepacol lozenges, throat spray, warm tea or water with lemon/honey, popsicles or ice, or OTC cold relief medicine for throat discomfort.   For congestion: take a daily anti-histamine like Zyrtec, Claritin, and a oral decongestant, such as pseudoephedrine.  You can also use Flonase 1-2 sprays in each nostril daily.   It is important to stay hydrated: drink plenty of fluids (water, gatorade/powerade/pedialyte, juices, or teas) to keep your throat moisturized and help further relieve irritation/discomfort.    ED Prescriptions     Medication Sig Dispense Auth. Provider   predniSONE (DELTASONE) 20 MG tablet Take 2 tablets (40 mg total) by mouth daily.  10 tablet Talayla Doyel R, NP   benzonatate (TESSALON) 100 MG capsule Take 1 capsule (100 mg total) by mouth every 8 (eight) hours. 21 capsule Patrece Tallie R, NP   promethazine-dextromethorphan (PROMETHAZINE-DM) 6.25-15 MG/5ML syrup Take 5 mLs by mouth at bedtime as needed for cough. 118 mL Malley Hauter, Elita Boone, NP      PDMP not reviewed this encounter.   Valinda Hoar, [place] 08/22/23 [phone]

## 2023-08-22 NOTE — Discharge Instructions (Addendum)
COVID-19 is a virus and should steadily improve in time, most viruses can take up to 7 to 10 days before you truly start to see a turnaround however things will get better  Per the CDC, you will need to quarantine until without fever for 24 hours, if experiencing no fever you may continue activity wearing mask until all symptoms have resolved  Begin prednisone every morning with food for 5 days to reduce inflammation to the airway and to the muscles, will help with chest pain and tightness  May use Tessalon pill every 8 hours to help calm your coughing  May use cough syrup at bedtime for additional comfort, be mindful this will make you feel sleepy    You can take Tylenol and/or Ibuprofen as needed for fever reduction and pain relief.   For cough: honey 1/2 to 1 teaspoon (you can dilute the honey in water or another fluid).  You can also use guaifenesin and dextromethorphan for cough. You can use a humidifier for chest congestion and cough.  If you don't have a humidifier, you can sit in the bathroom with the hot shower running.      For sore throat: try warm salt water gargles, cepacol lozenges, throat spray, warm tea or water with lemon/honey, popsicles or ice, or OTC cold relief medicine for throat discomfort.   For congestion: take a daily anti-histamine like Zyrtec, Claritin, and a oral decongestant, such as pseudoephedrine.  You can also use Flonase 1-2 sprays in each nostril daily.   It is important to stay hydrated: drink plenty of fluids (water, gatorade/powerade/pedialyte, juices, or teas) to keep your throat moisturized and help further relieve irritation/discomfort.

## 2023-08-23 ENCOUNTER — Ambulatory Visit: Payer: BC Managed Care – PPO
